# Patient Record
Sex: Female | Born: 1975
Health system: Southern US, Community
[De-identification: ages and names within clinical notes are randomized; demographics above are authoritative.]

## PROBLEM LIST (undated history)

## (undated) DIAGNOSIS — G43909 Migraine, unspecified, not intractable, without status migrainosus: Secondary | ICD-10-CM

## (undated) DIAGNOSIS — Z9889 Other specified postprocedural states: Secondary | ICD-10-CM

## (undated) DIAGNOSIS — G4730 Sleep apnea, unspecified: Secondary | ICD-10-CM

## (undated) DIAGNOSIS — E282 Polycystic ovarian syndrome: Secondary | ICD-10-CM

## (undated) DIAGNOSIS — R112 Nausea with vomiting, unspecified: Secondary | ICD-10-CM

## (undated) HISTORY — PX: ROUX-EN-Y GASTRIC BYPASS: SHX1104

## (undated) HISTORY — PX: ABDOMINAL HYSTERECTOMY: SHX81

---
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
## 1998-03-19 ENCOUNTER — Emergency Department (HOSPITAL_COMMUNITY): Admission: EM | Admit: 1998-03-19 | Discharge: 1998-03-19 | Payer: Self-pay | Admitting: Emergency Medicine

## 1998-03-21 ENCOUNTER — Ambulatory Visit (HOSPITAL_COMMUNITY): Admission: RE | Admit: 1998-03-21 | Discharge: 1998-03-21 | Payer: Self-pay | Admitting: Emergency Medicine

## 1998-03-21 ENCOUNTER — Encounter: Payer: Self-pay | Admitting: Emergency Medicine

## 1998-04-17 ENCOUNTER — Emergency Department (HOSPITAL_COMMUNITY): Admission: EM | Admit: 1998-04-17 | Discharge: 1998-04-18 | Payer: Self-pay | Admitting: Emergency Medicine

## 1999-01-25 ENCOUNTER — Emergency Department (HOSPITAL_COMMUNITY): Admission: EM | Admit: 1999-01-25 | Discharge: 1999-01-25 | Payer: Self-pay | Admitting: Internal Medicine

## 2000-12-31 ENCOUNTER — Emergency Department (HOSPITAL_COMMUNITY): Admission: EM | Admit: 2000-12-31 | Discharge: 2001-01-01 | Payer: Self-pay | Admitting: Emergency Medicine

## 2001-04-27 ENCOUNTER — Emergency Department (HOSPITAL_COMMUNITY): Admission: EM | Admit: 2001-04-27 | Discharge: 2001-04-27 | Payer: Self-pay | Admitting: *Deleted

## 2001-05-10 DIAGNOSIS — E282 Polycystic ovarian syndrome: Secondary | ICD-10-CM

## 2001-05-10 HISTORY — DX: Polycystic ovarian syndrome: E28.2

## 2002-01-03 ENCOUNTER — Emergency Department (HOSPITAL_COMMUNITY): Admission: EM | Admit: 2002-01-03 | Discharge: 2002-01-03 | Payer: Self-pay | Admitting: Emergency Medicine

## 2002-09-18 ENCOUNTER — Emergency Department (HOSPITAL_COMMUNITY): Admission: EM | Admit: 2002-09-18 | Discharge: 2002-09-18 | Payer: Self-pay | Admitting: Emergency Medicine

## 2003-07-17 ENCOUNTER — Emergency Department (HOSPITAL_COMMUNITY): Admission: EM | Admit: 2003-07-17 | Discharge: 2003-07-17 | Payer: Self-pay | Admitting: Emergency Medicine

## 2003-08-14 ENCOUNTER — Other Ambulatory Visit: Admission: RE | Admit: 2003-08-14 | Discharge: 2003-08-14 | Payer: Self-pay | Admitting: Obstetrics and Gynecology

## 2005-01-23 ENCOUNTER — Emergency Department (HOSPITAL_COMMUNITY): Admission: EM | Admit: 2005-01-23 | Discharge: 2005-01-23 | Payer: Self-pay | Admitting: Emergency Medicine

## 2005-09-14 ENCOUNTER — Emergency Department (HOSPITAL_COMMUNITY): Admission: EM | Admit: 2005-09-14 | Discharge: 2005-09-14 | Payer: Self-pay | Admitting: Emergency Medicine

## 2006-03-24 ENCOUNTER — Encounter: Admission: RE | Admit: 2006-03-24 | Discharge: 2006-03-24 | Payer: Self-pay | Admitting: *Deleted

## 2006-04-05 ENCOUNTER — Encounter: Admission: RE | Admit: 2006-04-05 | Discharge: 2006-07-04 | Payer: Self-pay | Admitting: *Deleted

## 2006-06-15 ENCOUNTER — Emergency Department (HOSPITAL_COMMUNITY): Admission: EM | Admit: 2006-06-15 | Discharge: 2006-06-15 | Payer: Self-pay | Admitting: Emergency Medicine

## 2006-07-15 ENCOUNTER — Emergency Department (HOSPITAL_COMMUNITY): Admission: EM | Admit: 2006-07-15 | Discharge: 2006-07-15 | Payer: Self-pay | Admitting: Emergency Medicine

## 2007-03-09 ENCOUNTER — Emergency Department (HOSPITAL_COMMUNITY): Admission: EM | Admit: 2007-03-09 | Discharge: 2007-03-10 | Payer: Self-pay | Admitting: Emergency Medicine

## 2007-04-09 ENCOUNTER — Emergency Department (HOSPITAL_COMMUNITY): Admission: EM | Admit: 2007-04-09 | Discharge: 2007-04-10 | Payer: Self-pay | Admitting: Emergency Medicine

## 2007-04-10 ENCOUNTER — Emergency Department (HOSPITAL_COMMUNITY): Admission: EM | Admit: 2007-04-10 | Discharge: 2007-04-11 | Payer: Self-pay | Admitting: Emergency Medicine

## 2007-09-22 ENCOUNTER — Emergency Department (HOSPITAL_COMMUNITY): Admission: EM | Admit: 2007-09-22 | Discharge: 2007-09-22 | Payer: Self-pay | Admitting: Emergency Medicine

## 2007-09-24 ENCOUNTER — Emergency Department (HOSPITAL_COMMUNITY): Admission: EM | Admit: 2007-09-24 | Discharge: 2007-09-24 | Payer: Self-pay | Admitting: Emergency Medicine

## 2008-05-10 HISTORY — PX: OTHER SURGICAL HISTORY: SHX169

## 2008-11-17 IMAGING — CR DG CHEST 2V
2 series · 2 of 2 positions shown · non-contrast
Comparison: 06/15/2006.

PA and lateral chest, 03/09/2007, [DATE] hours.
INDICATION: Nausea/vomiting, headache.

[w chest pa *]
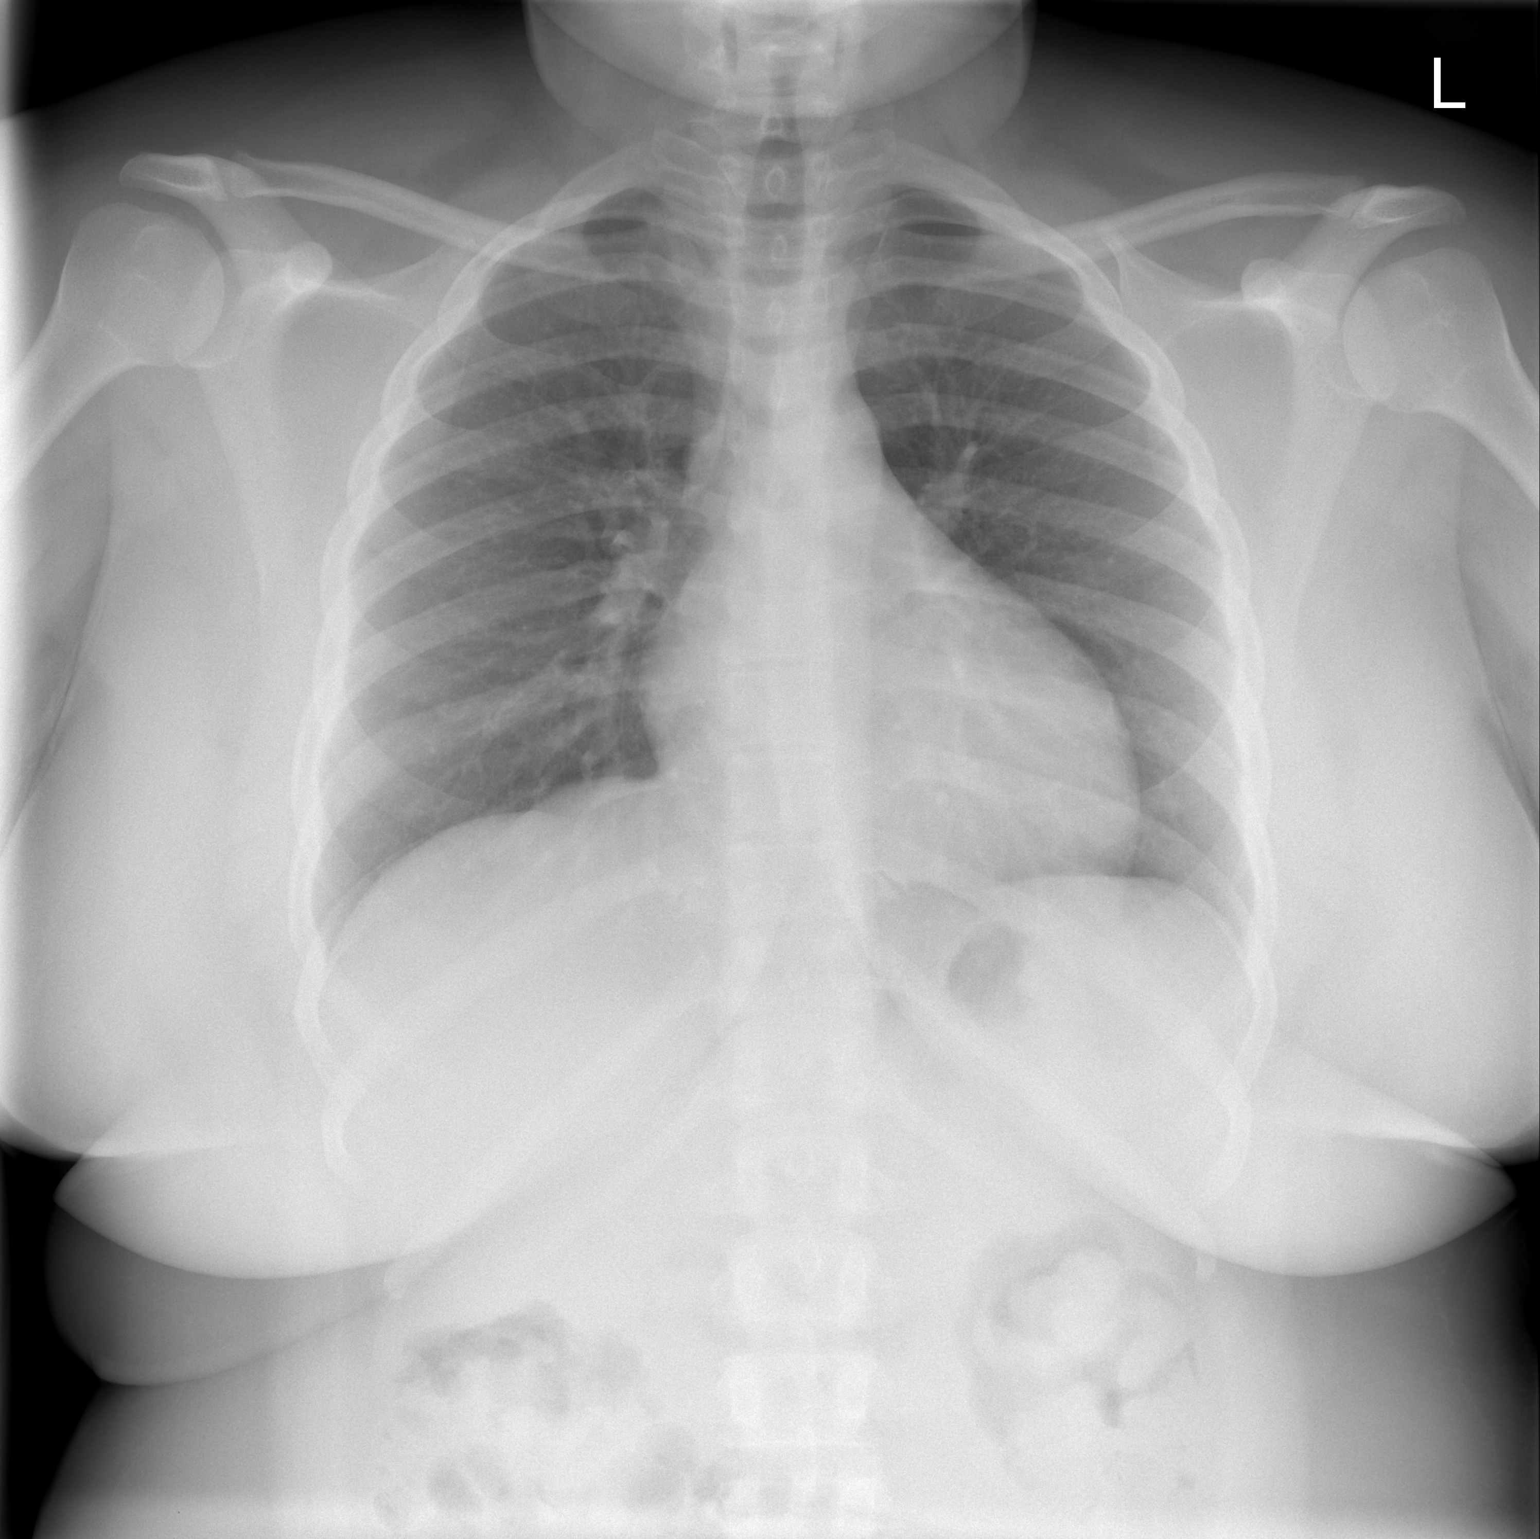

[w chest lat]
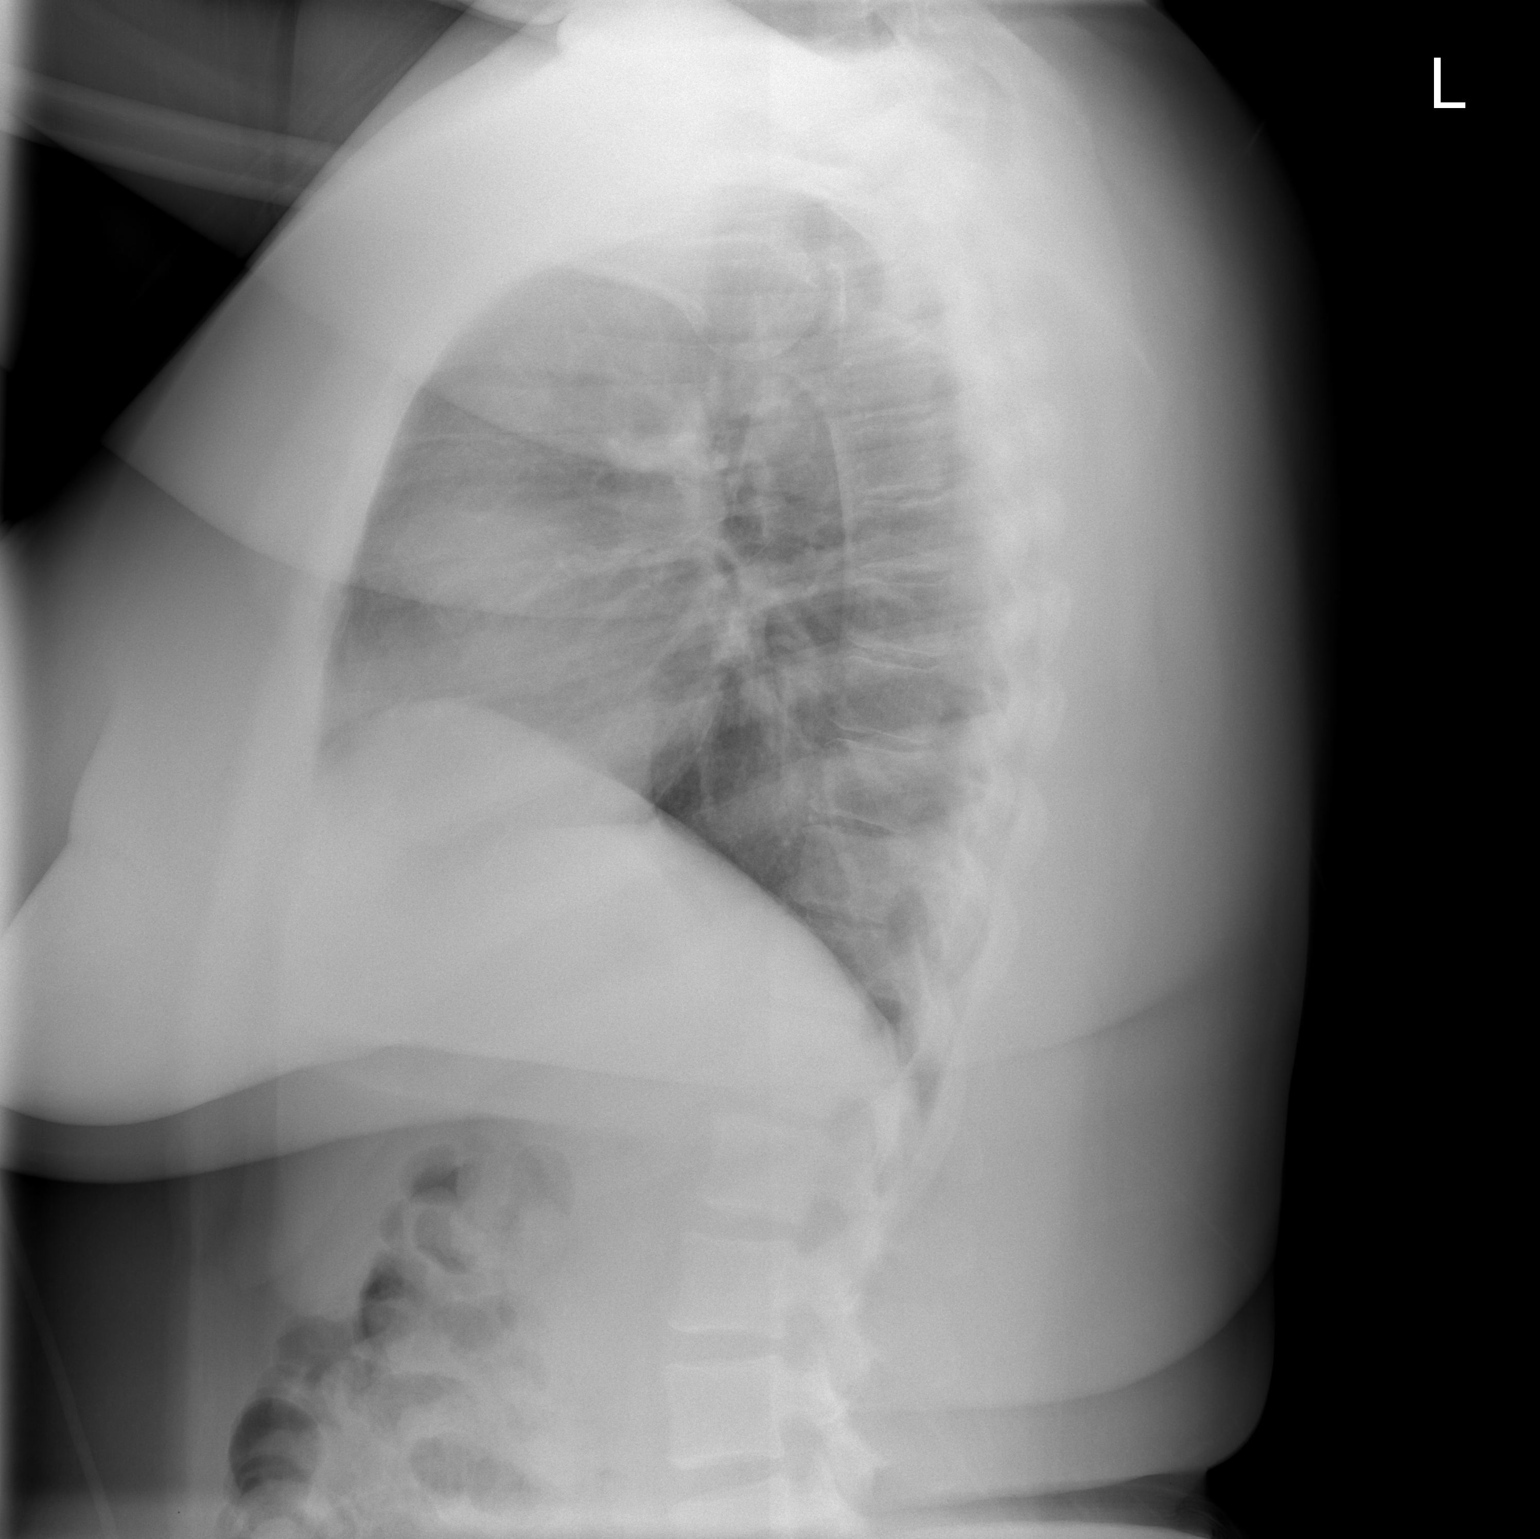

[2 of 2 positions shown; findings below may reference images not displayed]

FINDINGS: Heart size is within normal limits. Lungs appear clear. No edema,
effusions, airspace disease. Visualized upper abdomen unremarkable.
IMPRESSION: No active cardiac pulmonary disease.

## 2009-02-17 ENCOUNTER — Encounter (INDEPENDENT_AMBULATORY_CARE_PROVIDER_SITE_OTHER): Payer: Self-pay | Admitting: Obstetrics and Gynecology

## 2009-02-17 ENCOUNTER — Ambulatory Visit (HOSPITAL_COMMUNITY): Admission: RE | Admit: 2009-02-17 | Discharge: 2009-02-17 | Payer: Self-pay | Admitting: Obstetrics and Gynecology

## 2009-07-18 ENCOUNTER — Emergency Department (HOSPITAL_COMMUNITY): Admission: EM | Admit: 2009-07-18 | Discharge: 2009-07-18 | Payer: Self-pay | Admitting: Emergency Medicine

## 2010-03-04 ENCOUNTER — Encounter: Admission: RE | Admit: 2010-03-04 | Discharge: 2010-03-04 | Payer: Self-pay | Admitting: Obstetrics and Gynecology

## 2010-08-13 LAB — CBC
HCT: 34.4 % — ABNORMAL LOW (ref 36.0–46.0)
Hemoglobin: 11.2 g/dL — ABNORMAL LOW (ref 12.0–15.0)
MCHC: 32.4 g/dL (ref 30.0–36.0)
WBC: 11.9 10*3/uL — ABNORMAL HIGH (ref 4.0–10.5)

## 2010-08-13 LAB — BASIC METABOLIC PANEL
BUN: 9 mg/dL (ref 6–23)
Glucose, Bld: 77 mg/dL (ref 70–99)
Potassium: 4.2 mEq/L (ref 3.5–5.1)

## 2010-09-07 ENCOUNTER — Emergency Department (HOSPITAL_COMMUNITY)
Admission: EM | Admit: 2010-09-07 | Discharge: 2010-09-07 | Disposition: A | Discharge status: Home / Self Care | Payer: BC Managed Care – PPO | Attending: Emergency Medicine | Admitting: Emergency Medicine

## 2010-09-07 DIAGNOSIS — R51 Headache: Secondary | ICD-10-CM | POA: Insufficient documentation

## 2010-09-07 DIAGNOSIS — R112 Nausea with vomiting, unspecified: Secondary | ICD-10-CM | POA: Insufficient documentation

## 2010-09-07 LAB — CBC
Hemoglobin: 13.2 g/dL (ref 12.0–15.0)
RBC: 5.09 MIL/uL (ref 3.87–5.11)
RDW: 14.7 % (ref 11.5–15.5)
WBC: 10 10*3/uL (ref 4.0–10.5)

## 2010-09-07 LAB — COMPREHENSIVE METABOLIC PANEL
Calcium: 9.1 mg/dL (ref 8.4–10.5)
Chloride: 100 mEq/L (ref 96–112)
Creatinine, Ser: 0.71 mg/dL (ref 0.4–1.2)
GFR calc Af Amer: >60 mL/min (ref >60)
Glucose, Bld: 97 mg/dL (ref 70–99)
Potassium: 3.5 mEq/L (ref 3.5–5.1)
Sodium: 136 mEq/L (ref 135–145)
Total Bilirubin: 0.2 mg/dL — ABNORMAL LOW (ref 0.3–1.2)
Total Protein: 8 g/dL (ref 6.0–8.3)

## 2010-09-07 LAB — URINALYSIS, ROUTINE W REFLEX MICROSCOPIC
Bilirubin Urine: NEGATIVE
pH: 6 (ref 5.0–8.0)

## 2010-09-07 LAB — DIFFERENTIAL
Basophils Relative: 0 % (ref 0–1)
Eosinophils Absolute: 0.1 10*3/uL (ref 0.0–0.7)
Lymphocytes Relative: 27 % (ref 12–46)
Lymphs Abs: 2.7 10*3/uL (ref 0.7–4.0)
Monocytes Relative: 7 % (ref 3–12)

## 2010-09-07 LAB — POCT PREGNANCY, URINE: Preg Test, Ur: NEGATIVE

## 2010-09-07 LAB — GLUCOSE, CAPILLARY: Glucose-Capillary: 81 mg/dL (ref 70–99)

## 2011-02-03 LAB — URINALYSIS, ROUTINE W REFLEX MICROSCOPIC
Bilirubin Urine: NEGATIVE
Glucose, UA: NEGATIVE
Hgb urine dipstick: NEGATIVE
Ketones, ur: NEGATIVE
Nitrite: NEGATIVE
Protein, ur: NEGATIVE
Specific Gravity, Urine: 1.014
Urobilinogen, UA: 0.2
pH: 6.5

## 2011-02-03 LAB — URINE MICROSCOPIC-ADD ON

## 2011-02-15 LAB — COMPREHENSIVE METABOLIC PANEL
Albumin: 3.6
BUN: 7
GFR calc Af Amer: >60
GFR calc non Af Amer: >60
Glucose, Bld: 103 — ABNORMAL HIGH
Potassium: 3.5
Sodium: 137
Total Protein: 8

## 2011-02-15 LAB — ACETAMINOPHEN LEVEL: Acetaminophen (Tylenol), Serum: <10 — ABNORMAL LOW

## 2011-02-16 LAB — COMPREHENSIVE METABOLIC PANEL
ALT: 24
AST: 32
Alkaline Phosphatase: 68
BUN: 11
CO2: 33 — ABNORMAL HIGH
Chloride: 103
GFR calc Af Amer: >60
GFR calc non Af Amer: >60
Glucose, Bld: 94
Total Bilirubin: 0.8

## 2011-02-16 LAB — PREGNANCY, URINE: Preg Test, Ur: NEGATIVE

## 2011-02-16 LAB — URINALYSIS, ROUTINE W REFLEX MICROSCOPIC
Glucose, UA: NEGATIVE
Ketones, ur: NEGATIVE
Urobilinogen, UA: 0.2
pH: 6.5

## 2011-02-16 LAB — RAPID URINE DRUG SCREEN, HOSP PERFORMED
Barbiturates: NOT DETECTED
Opiates: POSITIVE — AB

## 2011-02-16 LAB — ACETAMINOPHEN LEVEL: Acetaminophen (Tylenol), Serum: 26.3

## 2011-02-16 LAB — SALICYLATE LEVEL: Salicylate Lvl: <4

## 2011-02-17 LAB — URINE MICROSCOPIC-ADD ON

## 2011-02-17 LAB — CBC
HCT: 41.3
Platelets: 344
RDW: 14
WBC: 7

## 2011-02-17 LAB — DIFFERENTIAL
Basophils Absolute: 0
Eosinophils Relative: 1
Lymphocytes Relative: 24
Lymphs Abs: 1.7
Neutro Abs: 4.2
Neutrophils Relative %: 60

## 2011-02-17 LAB — URINALYSIS, ROUTINE W REFLEX MICROSCOPIC
Glucose, UA: NEGATIVE
Ketones, ur: NEGATIVE
Nitrite: NEGATIVE
pH: 6

## 2011-02-17 LAB — BASIC METABOLIC PANEL
Calcium: 9.1
Creatinine, Ser: 0.73
GFR calc non Af Amer: >60
Potassium: 3.7
Sodium: 135

## 2011-04-05 ENCOUNTER — Encounter (HOSPITAL_COMMUNITY): Payer: Self-pay

## 2011-04-05 ENCOUNTER — Encounter (HOSPITAL_COMMUNITY): Payer: Self-pay | Admitting: Pharmacy Technician

## 2011-04-05 ENCOUNTER — Ambulatory Visit (HOSPITAL_COMMUNITY)
Admission: RE | Admit: 2011-04-05 | Discharge: 2011-04-05 | Disposition: A | Discharge status: Home / Self Care | Payer: BC Managed Care – PPO | Source: Ambulatory Visit | Attending: Obstetrics and Gynecology | Admitting: Obstetrics and Gynecology

## 2011-04-05 ENCOUNTER — Other Ambulatory Visit: Payer: Self-pay | Admitting: Obstetrics and Gynecology

## 2011-04-05 ENCOUNTER — Other Ambulatory Visit: Payer: Self-pay

## 2011-04-05 DIAGNOSIS — Z01812 Encounter for preprocedural laboratory examination: Secondary | ICD-10-CM | POA: Insufficient documentation

## 2011-04-05 DIAGNOSIS — Z01810 Encounter for preprocedural cardiovascular examination: Secondary | ICD-10-CM | POA: Insufficient documentation

## 2011-04-05 HISTORY — DX: Sleep apnea, unspecified: G47.30

## 2011-04-05 HISTORY — DX: Polycystic ovarian syndrome: E28.2

## 2011-04-05 HISTORY — DX: Nausea with vomiting, unspecified: R11.2

## 2011-04-05 HISTORY — DX: Other specified postprocedural states: Z98.890

## 2011-04-05 LAB — CBC
HCT: 39.2 % (ref 36.0–46.0)
Hemoglobin: 12.9 g/dL (ref 12.0–15.0)
MCH: 26.2 pg (ref 26.0–34.0)
MCHC: 32.9 g/dL (ref 30.0–36.0)
RDW: 14.5 % (ref 11.5–15.5)

## 2011-04-05 LAB — BASIC METABOLIC PANEL
BUN: 11 mg/dL (ref 6–23)
Calcium: 9.5 mg/dL (ref 8.4–10.5)
GFR calc Af Amer: >90 mL/min (ref >90)
GFR calc non Af Amer: >90 mL/min (ref >90)
Glucose, Bld: 81 mg/dL (ref 70–99)
Sodium: 135 mEq/L (ref 135–145)

## 2011-04-05 NOTE — Patient Instructions (Signed)
20 Lizbeth Feijoo  04/05/2011   Your procedure is scheduled on: 04-08-2011  Report to [facility] at 1100 AM.  Call this number if you have problems the morning of surgery: [phone]   Remember:   Do not eat food:After Midnight.  May have clear liquids:up to 6 Hours before arrival.clear liquids midnight until 0700 am day of surgery, then nothing by mouth  Clear liquids include soda, tea, black coffee, apple or grape juice, broth.  Take these medicines the morning of surgery with A SIP OF WATER:no meds to take   Do not wear jewelry, make-up or nail polish.  Do not wear lotions, powders, or perfumes.   Do not shave 48 hours prior to surgery.  Do not bring valuables to the hospital.  Contacts, dentures or bridgework may not be worn into surgery.  Leave suitcase in the car. After surgery it may be brought to your room.  For patients admitted to the hospital, checkout time is 11:00 AM the day of discharge.   Patients discharged the day of surgery will not be allowed to drive home.  Name and phone number of your driver  Special Instructions: CHG Shower Use Special Wash: 1/2 bottle night before surgery and 1/2 bottle morning of surgery.   Please read over the following fact sheets that you were given: MRSA Information Cain Sieve rn pre op nurse call if question [phone]

## 2011-04-08 ENCOUNTER — Encounter (HOSPITAL_COMMUNITY): Payer: Self-pay | Admitting: *Deleted

## 2011-04-08 ENCOUNTER — Ambulatory Visit (HOSPITAL_COMMUNITY): Payer: BC Managed Care – PPO | Admitting: Registered Nurse

## 2011-04-08 ENCOUNTER — Other Ambulatory Visit: Payer: Self-pay | Admitting: Obstetrics and Gynecology

## 2011-04-08 ENCOUNTER — Encounter (HOSPITAL_COMMUNITY)
Admission: RE | Disposition: A | Discharge status: Home / Self Care | Payer: Self-pay | Source: Ambulatory Visit | Attending: Obstetrics and Gynecology

## 2011-04-08 ENCOUNTER — Ambulatory Visit (HOSPITAL_COMMUNITY)
Admission: RE | Admit: 2011-04-08 | Discharge: 2011-04-09 | Disposition: A | Discharge status: Home / Self Care | Payer: BC Managed Care – PPO | Source: Ambulatory Visit | Attending: Obstetrics and Gynecology | Admitting: Obstetrics and Gynecology

## 2011-04-08 ENCOUNTER — Encounter (HOSPITAL_COMMUNITY): Payer: Self-pay | Admitting: Registered Nurse

## 2011-04-08 DIAGNOSIS — E119 Type 2 diabetes mellitus without complications: Secondary | ICD-10-CM | POA: Insufficient documentation

## 2011-04-08 DIAGNOSIS — IMO0001 Reserved for inherently not codable concepts without codable children: Secondary | ICD-10-CM | POA: Insufficient documentation

## 2011-04-08 DIAGNOSIS — N949 Unspecified condition associated with female genital organs and menstrual cycle: Secondary | ICD-10-CM | POA: Insufficient documentation

## 2011-04-08 DIAGNOSIS — Z90710 Acquired absence of both cervix and uterus: Secondary | ICD-10-CM

## 2011-04-08 DIAGNOSIS — N840 Polyp of corpus uteri: Secondary | ICD-10-CM | POA: Insufficient documentation

## 2011-04-08 DIAGNOSIS — R112 Nausea with vomiting, unspecified: Secondary | ICD-10-CM | POA: Insufficient documentation

## 2011-04-08 DIAGNOSIS — N938 Other specified abnormal uterine and vaginal bleeding: Secondary | ICD-10-CM | POA: Insufficient documentation

## 2011-04-08 HISTORY — PX: ROBOTIC ASSISTED LAP VAGINAL HYSTERECTOMY: SHX2362

## 2011-04-08 LAB — GLUCOSE, CAPILLARY: Glucose-Capillary: 135 mg/dL — ABNORMAL HIGH (ref 70–99)

## 2011-04-08 LAB — TYPE AND SCREEN

## 2011-04-08 LAB — ABO/RH: ABO/RH(D): O POS

## 2011-04-08 SURGERY — ROBOTIC ASSISTED LAPAROSCOPIC VAGINAL HYSTERECTOMY
Anesthesia: General | Site: Abdomen | Wound class: Clean Contaminated

## 2011-04-08 MED ORDER — HYDROMORPHONE HCL PF 1 MG/ML IJ SOLN
0.2000 mg | INTRAMUSCULAR | Status: DC | PRN
Start: 2011-04-08 — End: 2011-04-09

## 2011-04-08 MED ORDER — SUFENTANIL CITRATE 50 MCG/ML IV SOLN
INTRAVENOUS | Status: DC | PRN
  Administered 2011-04-08 (×2): 5 ug via INTRAVENOUS
  Administered 2011-04-08 (×2): 10 ug via INTRAVENOUS
  Administered 2011-04-08: 5 ug via INTRAVENOUS

## 2011-04-08 MED ORDER — LACTATED RINGERS IV SOLN
INTRAVENOUS | Status: DC | PRN
  Administered 2011-04-08 (×3): via INTRAVENOUS

## 2011-04-08 MED ORDER — OXYCODONE-ACETAMINOPHEN 5-325 MG PO TABS: 1.0000 | ORAL_TABLET | ORAL | Status: DC | PRN

## 2011-04-08 MED ORDER — HYDROMORPHONE 0.3 MG/ML IV SOLN
INTRAVENOUS | Status: DC
  Administered 2011-04-08: 7.5 mg via INTRAVENOUS
  Administered 2011-04-08: 0.799 mg via INTRAVENOUS
  Administered 2011-04-09: 0.4 mg via INTRAVENOUS
  Administered 2011-04-09: 0.799 mg via INTRAVENOUS
  Administered 2011-04-09: 0.2 mg via INTRAVENOUS
  Filled 2011-04-08: qty 25

## 2011-04-08 MED ORDER — GLYCOPYRROLATE 0.2 MG/ML IJ SOLN
INTRAMUSCULAR | Status: DC | PRN
  Administered 2011-04-08: .6 mg via INTRAVENOUS

## 2011-04-08 MED ORDER — MIDAZOLAM HCL 5 MG/5ML IJ SOLN
INTRAMUSCULAR | Status: DC | PRN
  Administered 2011-04-08: 2 mg via INTRAVENOUS

## 2011-04-08 MED ORDER — ONDANSETRON HCL 4 MG/2ML IJ SOLN
INTRAMUSCULAR | Status: DC | PRN
  Administered 2011-04-08: 4 mg via INTRAVENOUS

## 2011-04-08 MED ORDER — KETOROLAC TROMETHAMINE 60 MG/2ML IM SOLN
INTRAMUSCULAR | Status: DC | PRN
  Administered 2011-04-08: 30 mg via INTRAMUSCULAR

## 2011-04-08 MED ORDER — ZOLPIDEM TARTRATE 5 MG PO TABS: 5.0000 mg | ORAL_TABLET | Freq: Every evening | ORAL | Status: DC | PRN

## 2011-04-08 MED ORDER — SODIUM CHLORIDE 0.9 % IJ SOLN
INTRAMUSCULAR | Status: DC | PRN
  Administered 2011-04-08: 5 mL

## 2011-04-08 MED ORDER — ONDANSETRON HCL 4 MG/2ML IJ SOLN: 4.0000 mg | Freq: Four times a day (QID) | INTRAMUSCULAR | Status: DC | PRN

## 2011-04-08 MED ORDER — KETOROLAC TROMETHAMINE 30 MG/ML IJ SOLN
30.0000 mg | Freq: Four times a day (QID) | INTRAMUSCULAR | Status: DC
  Administered 2011-04-09 (×2): 30 mg via INTRAVENOUS
  Filled 2011-04-08 (×6): qty 1

## 2011-04-08 MED ORDER — LACTATED RINGERS IR SOLN
Status: DC | PRN
  Administered 2011-04-08: 1000 mL

## 2011-04-08 MED ORDER — MENTHOL 3 MG MT LOZG
1.0000 | LOZENGE | OROMUCOSAL | Status: DC | PRN
  Filled 2011-04-08: qty 9

## 2011-04-08 MED ORDER — LACTATED RINGERS IV SOLN
INTRAVENOUS | Status: DC
  Administered 2011-04-09: 125 mL/h via INTRAVENOUS

## 2011-04-08 MED ORDER — IBUPROFEN 800 MG PO TABS: 800.0000 mg | ORAL_TABLET | Freq: Three times a day (TID) | ORAL | Status: DC | PRN

## 2011-04-08 MED ORDER — CLINDAMYCIN PHOSPHATE 900 MG/50ML IV SOLN
900.0000 mg | Freq: Three times a day (TID) | INTRAVENOUS | Status: AC
  Administered 2011-04-08 – 2011-04-09 (×2): 900 mg via INTRAVENOUS
  Filled 2011-04-08 (×2): qty 50

## 2011-04-08 MED ORDER — KETOROLAC TROMETHAMINE 30 MG/ML IJ SOLN
30.0000 mg | Freq: Four times a day (QID) | INTRAMUSCULAR | Status: DC
  Filled 2011-04-08 (×4): qty 1

## 2011-04-08 MED ORDER — CIPROFLOXACIN IN D5W 400 MG/200ML IV SOLN
INTRAVENOUS | Status: DC | PRN
  Administered 2011-04-08: 400 mg via INTRAVENOUS

## 2011-04-08 MED ORDER — PROPOFOL 10 MG/ML IV EMUL
INTRAVENOUS | Status: DC | PRN
  Administered 2011-04-08: 100 mg via INTRAVENOUS
  Administered 2011-04-08: 200 mg via INTRAVENOUS

## 2011-04-08 MED ORDER — NEOSTIGMINE METHYLSULFATE 1 MG/ML IJ SOLN
INTRAMUSCULAR | Status: DC | PRN
  Administered 2011-04-08: 5 mg via INTRAVENOUS

## 2011-04-08 MED ORDER — CIPROFLOXACIN IN D5W 400 MG/200ML IV SOLN
400.0000 mg | Freq: Two times a day (BID) | INTRAVENOUS | Status: DC
  Administered 2011-04-09: 400 mg via INTRAVENOUS
  Filled 2011-04-08 (×2): qty 200

## 2011-04-08 MED ORDER — SODIUM CHLORIDE 0.9 % IJ SOLN: 9.0000 mL | INTRAMUSCULAR | Status: DC | PRN

## 2011-04-08 MED ORDER — PANTOPRAZOLE SODIUM 40 MG PO TBEC
40.0000 mg | DELAYED_RELEASE_TABLET | Freq: Every day | ORAL | Status: DC
  Filled 2011-04-08: qty 1

## 2011-04-08 MED ORDER — SCOPOLAMINE 1 MG/3DAYS TD PT72
1.0000 | MEDICATED_PATCH | TRANSDERMAL | Status: DC
  Filled 2011-04-08: qty 1

## 2011-04-08 MED ORDER — LACTATED RINGERS IV SOLN
INTRAVENOUS | Status: DC
  Administered 2011-04-08: 1000 mL via INTRAVENOUS

## 2011-04-08 MED ORDER — BUPIVACAINE HCL (PF) 0.25 % IJ SOLN
INTRAMUSCULAR | Status: DC | PRN
  Administered 2011-04-08: 22 mL

## 2011-04-08 MED ORDER — DEXAMETHASONE SODIUM PHOSPHATE 10 MG/ML IJ SOLN
INTRAMUSCULAR | Status: DC | PRN
  Administered 2011-04-08: 10 mg via INTRAVENOUS

## 2011-04-08 MED ORDER — ACETAMINOPHEN 10 MG/ML IV SOLN
INTRAVENOUS | Status: DC | PRN
  Administered 2011-04-08: 1000 mg via INTRAVENOUS

## 2011-04-08 MED ORDER — DIPHENHYDRAMINE HCL 50 MG/ML IJ SOLN: 12.5000 mg | Freq: Four times a day (QID) | INTRAMUSCULAR | Status: DC | PRN

## 2011-04-08 MED ORDER — DIPHENHYDRAMINE HCL 12.5 MG/5ML PO ELIX: 12.5000 mg | ORAL_SOLUTION | Freq: Four times a day (QID) | ORAL | Status: DC | PRN

## 2011-04-08 MED ORDER — SUCCINYLCHOLINE CHLORIDE 20 MG/ML IJ SOLN
INTRAMUSCULAR | Status: DC | PRN
  Administered 2011-04-08 (×2): 100 mg via INTRAVENOUS

## 2011-04-08 MED ORDER — ONDANSETRON HCL 4 MG PO TABS: 4.0000 mg | ORAL_TABLET | Freq: Four times a day (QID) | ORAL | Status: DC | PRN

## 2011-04-08 MED ORDER — PROMETHAZINE HCL 25 MG/ML IJ SOLN: 6.2500 mg | INTRAMUSCULAR | Status: DC | PRN

## 2011-04-08 MED ORDER — CLINDAMYCIN PHOSPHATE 600 MG/50ML IV SOLN
INTRAVENOUS | Status: DC | PRN
  Administered 2011-04-08: 900 mg via INTRAVENOUS

## 2011-04-08 MED ORDER — STERILE WATER FOR IRRIGATION IR SOLN
Status: DC | PRN
  Administered 2011-04-08: 3000 mL

## 2011-04-08 MED ORDER — ROCURONIUM BROMIDE 100 MG/10ML IV SOLN
INTRAVENOUS | Status: DC | PRN
  Administered 2011-04-08: 50 mg via INTRAVENOUS
  Administered 2011-04-08: 20 mg via INTRAVENOUS

## 2011-04-08 MED ORDER — CLINDAMYCIN PHOSPHATE 900 MG/50ML IV SOLN
900.0000 mg | INTRAVENOUS | Status: DC
  Filled 2011-04-08: qty 50

## 2011-04-08 MED ORDER — GLIMEPIRIDE 1 MG PO TABS
1.0000 mg | ORAL_TABLET | Freq: Every day | ORAL | Status: DC
  Administered 2011-04-09: 1 mg via ORAL
  Filled 2011-04-08: qty 1

## 2011-04-08 MED ORDER — HYDROMORPHONE HCL PF 1 MG/ML IJ SOLN
0.2500 mg | INTRAMUSCULAR | Status: DC | PRN
  Administered 2011-04-08: 0.5 mg via INTRAVENOUS
  Filled 2011-04-08: qty 1

## 2011-04-08 MED ORDER — NALOXONE HCL 0.4 MG/ML IJ SOLN: 0.4000 mg | INTRAMUSCULAR | Status: DC | PRN

## 2011-04-08 MED ORDER — CIPROFLOXACIN IN D5W 400 MG/200ML IV SOLN
400.0000 mg | INTRAVENOUS | Status: DC
  Filled 2011-04-08: qty 200

## 2011-04-08 MED ORDER — SCOPOLAMINE 1 MG/3DAYS TD PT72
MEDICATED_PATCH | TRANSDERMAL | Status: DC | PRN
  Administered 2011-04-08: 1.5 mg via TRANSDERMAL

## 2011-04-08 MED ORDER — KETOROLAC TROMETHAMINE 30 MG/ML IJ SOLN
INTRAMUSCULAR | Status: DC | PRN
  Administered 2011-04-08: 30 mg via INTRAVENOUS

## 2011-04-08 MED ORDER — LIDOCAINE HCL (CARDIAC) 20 MG/ML IV SOLN
INTRAVENOUS | Status: DC | PRN
  Administered 2011-04-08: 100 mg via INTRAVENOUS

## 2011-04-08 SURGICAL SUPPLY — 59 items
BAG URINE DRAINAGE (UROLOGICAL SUPPLIES) ×4 IMPLANT
BARRIER ADHS 3X4 INTERCEED (GAUZE/BANDAGES/DRESSINGS) ×4 IMPLANT
BLADELESS LONG 8MM (BLADE) ×4 IMPLANT
CABLE HIGH FREQUENCY MONO STRZ (ELECTRODE) ×4 IMPLANT
CATH FOLEY 3WAY  5CC 16FR (CATHETERS) ×1
CATH FOLEY 3WAY 5CC 16FR (CATHETERS) ×3 IMPLANT
CHLORAPREP W/TINT 26ML (MISCELLANEOUS) ×4 IMPLANT
CLOTH BEACON ORANGE TIMEOUT ST (SAFETY) ×4 IMPLANT
CONT PATH 16OZ SNAP LID 3702 (MISCELLANEOUS) ×4 IMPLANT
COVER MAYO STAND STRL (DRAPES) ×4 IMPLANT
COVER TIP SHEARS 8 DVNC (MISCELLANEOUS) ×3 IMPLANT
COVER TIP SHEARS 8MM DA VINCI (MISCELLANEOUS) ×1
DECANTER SPIKE VIAL GLASS SM (MISCELLANEOUS) IMPLANT
DERMABOND ADVANCED (GAUZE/BANDAGES/DRESSINGS) ×1
DERMABOND ADVANCED .7 DNX12 (GAUZE/BANDAGES/DRESSINGS) ×3 IMPLANT
DRAPE BACK TABLE (DRAPES) ×8 IMPLANT
DRAPE CAMERA CLOSED 9X96 (DRAPES) ×4 IMPLANT
DRAPE LG THREE QUARTER DISP (DRAPES) ×8 IMPLANT
DRAPE WARM FLUID 44X44 (DRAPE) ×4 IMPLANT
ELECT REM PT RETURN 9FT ADLT (ELECTROSURGICAL) ×4
ELECTRODE REM PT RTRN 9FT ADLT (ELECTROSURGICAL) ×3 IMPLANT
FILTER SMOKE EVAC LAPAROSHD (FILTER) ×4 IMPLANT
GAUZE VASELINE 3X9 (GAUZE/BANDAGES/DRESSINGS) IMPLANT
GLOVE BIO SURGEON STRL SZ 6.5 (GLOVE) ×8 IMPLANT
GLOVE BIOGEL PI IND STRL 7.0 (GLOVE) ×9 IMPLANT
GLOVE BIOGEL PI INDICATOR 7.0 (GLOVE) ×3
GOWN PREVENTION PLUS LG XLONG (DISPOSABLE) ×16 IMPLANT
KIT ACCESSORY DA VINCI DISP (KITS) ×1
KIT ACCESSORY DVNC DISP (KITS) ×3 IMPLANT
NEEDLE INSUFFLATION 14GA 120MM (NEEDLE) ×12 IMPLANT
NS IRRIG 1000ML POUR BTL (IV SOLUTION) ×4 IMPLANT
OCCLUDER COLPOPNEUMO (BALLOONS) ×4 IMPLANT
PAD PREP 24X48 CUFFED NSTRL (MISCELLANEOUS) IMPLANT
PLUG CATH AND CAP STER (CATHETERS) ×4 IMPLANT
SCISSORS LAP 5X35 DISP (ENDOMECHANICALS) IMPLANT
SET IRRIG TUBING LAPAROSCOPIC (IRRIGATION / IRRIGATOR) ×4 IMPLANT
SHEET LAVH (DRAPES) ×4 IMPLANT
SOLUTION ELECTROLUBE (MISCELLANEOUS) ×4 IMPLANT
SPONGE LAP 18X18 X RAY DECT (DISPOSABLE) IMPLANT
SUT VIC AB 0 CT1 27 (SUTURE) ×7
SUT VIC AB 0 CT1 27XBRD ANTBC (SUTURE) ×21 IMPLANT
SUT VIC AB 4-0 PS2 27 (SUTURE) ×4 IMPLANT
SUT VICRYL 0 UR6 27IN ABS (SUTURE) ×4 IMPLANT
SYR 50ML LL SCALE MARK (SYRINGE) ×4 IMPLANT
SYR CONTROL 10ML LL (SYRINGE) ×4 IMPLANT
SYSTEM CONVERTIBLE TROCAR (TROCAR) ×4 IMPLANT
TIP UTERINE 5.1X6CM LAV DISP (MISCELLANEOUS) IMPLANT
TIP UTERINE 6.7X10CM GRN DISP (MISCELLANEOUS) ×4 IMPLANT
TIP UTERINE 6.7X6CM WHT DISP (MISCELLANEOUS) IMPLANT
TIP UTERINE 6.7X8CM BLUE DISP (MISCELLANEOUS) IMPLANT
TOWEL OR 17X26 10 PK STRL BLUE (TOWEL DISPOSABLE) ×8 IMPLANT
TROCAR 12M 150ML BLUNT (TROCAR) IMPLANT
TROCAR BLADELESS OPT 5 100 (ENDOMECHANICALS) ×4 IMPLANT
TROCAR DISP BLADELESS 8 DVNC (TROCAR) ×3 IMPLANT
TROCAR DISP BLADELESS 8MM (TROCAR) ×1
TROCAR Z-THREAD 12X150 (TROCAR) ×4 IMPLANT
TROCAR Z-THREAD BLADED 12X100M (TROCAR) ×4 IMPLANT
TUBING FILTER THERMOFLATOR (ELECTROSURGICAL) ×4 IMPLANT
WARMER LAPAROSCOPE (MISCELLANEOUS) IMPLANT

## 2011-04-08 NOTE — Transfer of Care (Signed)
Immediate Anesthesia Transfer of Care Note  Patient: Helen Guerrero  Procedure(s) Performed:  ROBOTIC ASSISTED TOTAL HYSTERECTOMY WITH SALPINGO OOPHERECTOMY - DaVinci TLH with bilateral salpingectomy.    Patient Location: PACU  Anesthesia Type: General  Level of Consciousness: awake and alert   Airway & Oxygen Therapy: Patient Spontanous Breathing and Patient connected to face mask oxygen  Post-op Assessment: Report given to PACU RN and Post -op Vital signs reviewed and stable  Post vital signs: Reviewed and stable  Complications: No apparent anesthesia complications

## 2011-04-08 NOTE — Brief Op Note (Signed)
04/08/2011  4:46 PM  PATIENT:  Helen Guerrero  35 y.o. female  PRE-OPERATIVE DIAGNOSIS:  Dysfunctional Uterine Bleeding  POST-OPERATIVE DIAGNOSIS:  Dysfunctional Uterine Bleeding  PROCEDURE:  Procedure(s):Da Vinci ROBOTIC ASSISTED TOTAL HYSTERECTOMY WITH SALPINGECTOMY  SURGEON:  Surgeon(s): Serita Kyle, MD Alphonsus Sias. Fogleman  PHYSICIAN ASSISTANT:   ASSISTANTS: KELLY A FOGLEMAN  ANESTHESIA:   general  EBL:  Total I/O In: 2000 [I.V.:2000] Out: 380 [Urine:350; Blood:30]  BLOOD ADMINISTERED:none  DRAINS: none   LOCAL MEDICATIONS USED:  MARCAINE 10CC  SPECIMEN:  Source of Specimen:  uterus with cervix, both tubes  DISPOSITION OF SPECIMEN:  PATHOLOGY  COUNTS:  YES  TOURNIQUET:  * No tourniquets in log *  DICTATION: .Other Dictation: Dictation Number [id number]  PLAN OF CARE: Admit for overnight observation  PATIENT DISPOSITION:  PACU - hemodynamically stable.   Delay start of Pharmacological VTE agent (>24hrs) due to surgical blood loss or risk of bleeding:  no

## 2011-04-08 NOTE — Anesthesia Postprocedure Evaluation (Signed)
  Anesthesia Post-op Note  Patient: Helen Guerrero  Procedure(s) Performed:  ROBOTIC ASSISTED LAPAROSCOPIC VAGINAL HYSTERECTOMY; BILATERAL SALPINGECTOMY  Patient Location: PACU  Anesthesia Type: General  Level of Consciousness: awake and alert   Airway and Oxygen Therapy: Patient Spontanous Breathing  Post-op Pain: mild  Post-op Assessment: Post-op Vital signs reviewed, Patient's Cardiovascular Status Stable, Respiratory Function Stable, Patent Airway and No signs of Nausea or vomiting  Post-op Vital Signs: stable  Complications: No apparent anesthesia complications

## 2011-04-08 NOTE — Anesthesia Preprocedure Evaluation (Signed)
Anesthesia Evaluation  Patient identified by MRN, date of birth, ID band Patient awake    Reviewed: Allergy & Precautions, H&P , NPO status , Patient's Chart, lab work & pertinent test results  History of Anesthesia Complications (+) PONV  Airway Mallampati: III TM Distance: >3 FB Neck ROM: Full    Dental No notable dental hx.    Pulmonary neg pulmonary ROS, sleep apnea ,  clear to auscultation  Pulmonary exam normal       Cardiovascular neg cardio ROS Regular Normal    Neuro/Psych Negative Neurological ROS  Negative Psych ROS   GI/Hepatic negative GI ROS, Neg liver ROS,   Endo/Other  Negative Endocrine ROSDiabetes mellitus-  Renal/GU negative Renal ROS  Genitourinary negative   Musculoskeletal negative musculoskeletal ROS (+)   Abdominal (+) obese,   Peds negative pediatric ROS (+)  Hematology negative hematology ROS (+)   Anesthesia Other Findings   Reproductive/Obstetrics negative OB ROS                           Anesthesia Physical Anesthesia Plan  ASA: III  Anesthesia Plan: General   Post-op Pain Management:    Induction: Intravenous  Airway Management Planned: Oral ETT  Additional Equipment:   Intra-op Plan:   Post-operative Plan: Extubation in OR  Informed Consent: I have reviewed the patients History and Physical, chart, labs and discussed the procedure including the risks, benefits and alternatives for the proposed anesthesia with the patient or authorized representative who has indicated his/her understanding and acceptance.   Dental advisory given  Plan Discussed with: CRNA  Anesthesia Plan Comments: (Glidescope? Scopolamine patch)        Anesthesia Quick Evaluation

## 2011-04-08 NOTE — Preoperative (Signed)
Beta Blockers   Reason not to administer Beta Blockers:Not Applicable 

## 2011-04-09 ENCOUNTER — Encounter (HOSPITAL_COMMUNITY): Payer: Self-pay | Admitting: *Deleted

## 2011-04-09 DIAGNOSIS — N938 Other specified abnormal uterine and vaginal bleeding: Secondary | ICD-10-CM | POA: Diagnosis present

## 2011-04-09 DIAGNOSIS — Z90710 Acquired absence of both cervix and uterus: Secondary | ICD-10-CM | POA: Diagnosis not present

## 2011-04-09 LAB — CBC
HCT: 36.7 % (ref 36.0–46.0)
Platelets: 370 10*3/uL (ref 150–400)
RBC: 4.65 MIL/uL (ref 3.87–5.11)
RDW: 14.3 % (ref 11.5–15.5)
WBC: 12.2 10*3/uL — ABNORMAL HIGH (ref 4.0–10.5)

## 2011-04-09 LAB — BASIC METABOLIC PANEL
BUN: 6 mg/dL (ref 6–23)
CO2: 25 mEq/L (ref 19–32)
Chloride: 100 mEq/L (ref 96–112)
GFR calc Af Amer: >90 mL/min (ref >90)
Potassium: 4.2 mEq/L (ref 3.5–5.1)

## 2011-04-09 MED ORDER — PNEUMOCOCCAL VAC POLYVALENT 25 MCG/0.5ML IJ INJ
0.5000 mL | INJECTION | Freq: Once | INTRAMUSCULAR | Status: AC
  Administered 2011-04-09: 0.5 mL via INTRAMUSCULAR
  Filled 2011-04-09: qty 0.5

## 2011-04-09 MED ORDER — IBUPROFEN 800 MG PO TABS: 800.0000 mg | ORAL_TABLET | Freq: Three times a day (TID) | ORAL | Status: AC | PRN

## 2011-04-09 MED ORDER — OXYCODONE-ACETAMINOPHEN 5-325 MG PO TABS: 1.0000 | ORAL_TABLET | ORAL | Status: AC | PRN

## 2011-04-09 NOTE — Op Note (Signed)
NAMECHELSI, Helen Guerrero NO.:  [id number]  MEDICAL RECORD NO.:  [id number]  LOCATION:  1529                         FACILITY:  [facility]  PHYSICIAN:  Maxie Better, M.D.DATE OF BIRTH:  02-Jun-1975  DATE OF PROCEDURE:  04/08/2011 DATE OF DISCHARGE:                              OPERATIVE REPORT   PREOPERATIVE DIAGNOSIS:  Dysfunctional uterine bleeding.  POSTOPERATIVE DIAGNOSIS:  Dysfunctional uterine bleeding.  PROCEDURE:  Da Vinci robotic total hysterectomy, bilateral salpingectomy.  ANESTHESIA:  General.  SURGEON:  Maxie Better, M.D.  ASSISTANT:  Lendon Colonel, MD  PROCEDURE:  Under adequate general anesthesia, the patient was placed in the dorsal lithotomy position.  Examination under anesthesia was notable for a uterus that was appeared to be normal size.  No adnexal masses could be appreciated, but limited by the patient's body habitus.  The patient was then sterilely prepped and draped in usual fashion. Weighted speculum placed in the vagina.  Single-tooth tenaculum was used to grasp the anterior lip of the cervix and Sims cannula was used to retract the bladder anteriorly.  0 Vicryl figure-of-8 sutures were placed on the anterior and posterior lip of the cervix.  Uterus sounded to 10 cm.  A #10 intracavitary balloon manipulator was placed in the uterine cavity along with the medium KOH Rumi ring.  Once this was accomplished, the Foley catheter was placed sterilely.  Attention was then turned back to the abdomen where 0.25% Marcaine was injected and a supraumbilical incision made.  Veress needle was introduced until about 2.5 L of CO2 was insufflated.  The weighted speculum and this retractor was removed. The abdomen was insufflated of CO2.  Veress needle was then removed. A 12 mm disposable trocar was introduced into the abdomen without incident.  The robotic camera was then placed through that port.  Once this was done, the panoramic  inspection showed normal liver edge.  No other evidence of any other issues.  The robotic port sites were two 8 mm robotic instruments on the left, one on the right along with a 5 mm assistant port.  These sites were then resulted in the robotic instruments being placed.  Once this was done, the robot was then side docked from the left and attached to these ports. I then went to the surgical console.  The uterus was slightly enlarged without any nodule.  The ovaries were normal. The fallopian tubes were slightly elongated.  The round ligaments were bilaterally clamped, cauterized, and cut.  The mesosalpinx on the both fallopian tubes was serially clamped, cauterized, and cut until they were removed. Uterine vessels were then skeletonized bilaterally.  The bladder pertitoneum was opened transversely at that point and displaced inferiorly. Once the uterine vessels were secured, the bladder site was inspected and further dissection performed. The vaginal insufflator was filled. Subsequently, circumferential incision was then made over the core ring. The uterus was then detached from the vagina. The uterus with tubes was then removed through the vaginal opening. The vaginal cuff was then closed with 0 Vicryl figure-of-8 sutures.  The abdomen was then copiously irrigated. Good hemostasis subsequently noted. The robotic instruments were removed and the robot was undocked. Inspection showed good hemostasis. The abdomen was  deflated. The port sites was closed with 4-0 Vicryl subcuticular sutures, 0 Vicryl for fascial closure and dermabond.  The bimanual examination of the vaginal cuff showed good approximation.  SPECIMEN:  Uterus with cervix and fallopian tubes sent to pathology.  ESTIMATED BLOOD LOSS:  30 cc.  INTRAOPERATIVE FLUIDS:  2 L.  URINE OUTPUT:  350 cc clear yellow urine.  Sponge and instrument counts x2 was correct.  COMPLICATIONS:  None.  The patient tolerated procedure well and  transferred to recovery in stable condition.     Maxie Better, M.D.     Frazee/MEDQ  D:  04/08/2011  T:  04/09/2011  Job:  [id number]

## 2011-04-09 NOTE — Progress Notes (Signed)
Subjective: Patient reports no problems voiding. Feels well. Was too sleepy to eat last night. breaqkfast ordered this am  Objective: I have reviewed patient's vital signs.  vital signs, intake and output and labs. Filed Vitals:   04/09/11 0700  BP: 125/85  Pulse: 77  Temp: 98 F (36.7 C)  Resp: 20   I/O last 3 completed shifts: In: 2350 [I.V.:2350] Out: 2955 [Urine:2925; Blood:30]    Lab Results  Component Value Date   WBC 12.2* 04/09/2011   HGB 12.0 04/09/2011   HCT 36.7 04/09/2011   MCV 78.9 04/09/2011   PLT 370 04/09/2011   Lab Results  Component Value Date   CREATININE 0.69 04/09/2011    EXAM General: alert, cooperative and no distress Resp: clear to auscultation bilaterally Cardio: regular rate and rhythm, S1, S2 normal, no murmur, click, rub or gallop GI: soft, non-tender; bowel sounds normal; no masses,  no organomegaly and incision: clean and dry Extremities: no edema, redness or tenderness in the calves or thighs Vaginal Bleeding: none  Assessment: s/p Procedure(s): ROBOTIC ASSISTED TOTAL HYSTERECTOMY, BILATERAL SALPINGECTOMY: stable  Plan: Advance diet Discontinue IV fluids Discharge home  After breakfast. F/U 2 WK. D/C MEDS: PERCOCET, mOTRIN. D/C INSTRUCTIONS REVIEWED  LOS: 1 day    Hetal Proano A, MD 04/09/2011 7:21 AM    04/09/2011, 7:21 AM

## 2011-04-09 NOTE — Discharge Summary (Signed)
Physician Discharge Summary  Patient ID: Helen Guerrero MRN: [id number] DOB/AGE: Feb 25, 1976 35 y.o.  Admit date: 04/08/2011 Discharge date: 04/09/2011  Admission Diagnoses: DUB, morbid obesity, NIDDM  Discharge Diagnoses: same Principal Problem:  *DUB (dysfunctional uterine bleeding) Active Problems:  Status post hysterectomy   Discharged Condition:stable  Hospital Course: uncomplicated postop course. Pt underwent DaVinci Total hysterectomy, bilateral salpingectomy  Consults: none Significant Diagnostic Studies: noneTreatments: surgery: DaVinci total hysterectomy, bilateral salpingectomy  Discharge Exam: Blood pressure 125/85, pulse 77, temperature 98 F (36.7 C), temperature source Oral, resp. rate 20, height 5' (1.524 m), weight 110.224 kg (243 lb), last menstrual period 02/08/2011, SpO2 97.00%. General appearance: alert, cooperative and no distress Resp: clear to auscultation bilaterally Cardio: regular rate and rhythm, S1, S2 normal, no murmur, click, rub or gallop GI: soft, non-tender; bowel sounds normal; no masses,  no organomegaly Pelvic: deferred Extremities: no edema, redness or tenderness in the calves or thighs Incision/Wound:well approximated, no erythema.induration or exudate  Disposition: Home or Self Care  Discharge Orders    Future Orders Please Complete By Expires   Diet - low sodium heart healthy      Increase activity slowly      Discharge instructions      Comments:   Call if temperature greater than equal to 100.4, nothing per vagina for 4-6 weeks or severe nausea vomiting, increased incisional pain , drainage or redness in the incision site, no straining with bowel movements, showers no bath no lifting x 2 weeks, no driving x 2 wk   No wound care        Current Discharge Medication List    START taking these medications   Details  ibuprofen (ADVIL,MOTRIN) 800 MG tablet Take 1 tablet (800 mg total) by mouth every 8 (eight) hours as needed for  pain (mild pain). Qty: 30 tablet, Refills: 3    oxyCODONE-acetaminophen (PERCOCET) 5-325 MG per tablet Take 1-2 tablets by mouth every 4 (four) hours as needed (moderate to severe pain (when tolerating fluids)). Qty: 30 tablet, Refills: 0      CONTINUE these medications which have NOT CHANGED   Details  glimepiride (AMARYL) 1 MG tablet Take 1 mg by mouth every morning.        Follow-up Information    Follow up with Tymeir Weathington A, MD in 2 weeks.   Contact information:   [address] [place] [address] [phone]          Signed: Serita Kyle 04/09/2011, 7:30 AM

## 2012-05-14 ENCOUNTER — Emergency Department (HOSPITAL_COMMUNITY)
Admission: EM | Admit: 2012-05-14 | Discharge: 2012-05-14 | Disposition: A | Discharge status: Home / Self Care | Payer: Self-pay | Attending: Emergency Medicine | Admitting: Emergency Medicine

## 2012-05-14 ENCOUNTER — Encounter (HOSPITAL_COMMUNITY): Payer: Self-pay | Admitting: Emergency Medicine

## 2012-05-14 DIAGNOSIS — E119 Type 2 diabetes mellitus without complications: Secondary | ICD-10-CM | POA: Insufficient documentation

## 2012-05-14 DIAGNOSIS — Z8742 Personal history of other diseases of the female genital tract: Secondary | ICD-10-CM | POA: Insufficient documentation

## 2012-05-14 DIAGNOSIS — H53149 Visual discomfort, unspecified: Secondary | ICD-10-CM | POA: Insufficient documentation

## 2012-05-14 DIAGNOSIS — R110 Nausea: Secondary | ICD-10-CM | POA: Insufficient documentation

## 2012-05-14 DIAGNOSIS — G43909 Migraine, unspecified, not intractable, without status migrainosus: Secondary | ICD-10-CM | POA: Insufficient documentation

## 2012-05-14 DIAGNOSIS — Z90710 Acquired absence of both cervix and uterus: Secondary | ICD-10-CM | POA: Insufficient documentation

## 2012-05-14 DIAGNOSIS — I10 Essential (primary) hypertension: Secondary | ICD-10-CM | POA: Insufficient documentation

## 2012-05-14 LAB — CBC WITH DIFFERENTIAL/PLATELET
Basophils Absolute: 0 10*3/uL (ref 0.0–0.1)
HCT: 41.5 % (ref 36.0–46.0)
Lymphocytes Relative: 32 % (ref 12–46)
Neutro Abs: 5.5 10*3/uL (ref 1.7–7.7)
Platelets: 375 10*3/uL (ref 150–400)
RBC: 5.16 MIL/uL — ABNORMAL HIGH (ref 3.87–5.11)
RDW: 13.8 % (ref 11.5–15.5)
WBC: 9.1 10*3/uL (ref 4.0–10.5)

## 2012-05-14 LAB — BASIC METABOLIC PANEL
CO2: 29 mEq/L (ref 19–32)
Chloride: 97 mEq/L (ref 96–112)
Potassium: 3.8 mEq/L (ref 3.5–5.1)
Sodium: 136 mEq/L (ref 135–145)

## 2012-05-14 LAB — URINALYSIS, ROUTINE W REFLEX MICROSCOPIC
Glucose, UA: NEGATIVE mg/dL
Leukocytes, UA: NEGATIVE
Protein, ur: NEGATIVE mg/dL
Specific Gravity, Urine: 1.02 (ref 1.005–1.030)
Urobilinogen, UA: 0.2 mg/dL (ref 0.0–1.0)

## 2012-05-14 LAB — URINE MICROSCOPIC-ADD ON

## 2012-05-14 MED ORDER — SODIUM CHLORIDE 0.9 % IV BOLUS (SEPSIS)
1000.0000 mL | Freq: Once | INTRAVENOUS | Status: AC
  Administered 2012-05-14: 1000 mL via INTRAVENOUS

## 2012-05-14 MED ORDER — IBUPROFEN 600 MG PO TABS: 600.0000 mg | ORAL_TABLET | Freq: Four times a day (QID) | ORAL | Status: DC | PRN

## 2012-05-14 MED ORDER — HYDROCODONE-ACETAMINOPHEN 5-325 MG PO TABS: 1.0000 | ORAL_TABLET | Freq: Four times a day (QID) | ORAL | Status: DC | PRN

## 2012-05-14 MED ORDER — PROCHLORPERAZINE EDISYLATE 5 MG/ML IJ SOLN
10.0000 mg | Freq: Four times a day (QID) | INTRAMUSCULAR | Status: DC | PRN
  Administered 2012-05-14: 10 mg via INTRAVENOUS
  Filled 2012-05-14: qty 2

## 2012-05-14 MED ORDER — KETOROLAC TROMETHAMINE 30 MG/ML IJ SOLN
30.0000 mg | Freq: Once | INTRAMUSCULAR | Status: AC
  Administered 2012-05-14: 30 mg via INTRAVENOUS
  Filled 2012-05-14: qty 1

## 2012-05-14 NOTE — ED Provider Notes (Signed)
History     CSN: [id number]  Arrival date & time 05/14/12  0901   First MD Initiated Contact with Patient 05/14/12 [phone]      Chief Complaint  Patient presents with  . Migraine  . Hypertension    (Consider location/radiation/quality/duration/timing/severity/associated sxs/prior treatment) HPI Comments: Pt comes in with cc of headaches. Pt has hx of migraine type headaches, similar to current headaches, but they are in well control with OTC meds. Pt's current pain started last night, and is sharp, throbbing, right sided, with photophobia and nausea but no emesis. Pt has fam hx of migraines. No fevers, chills, neck pain, stiffness.  Patient is a 37 y.o. female presenting with migraines and hypertension. The history is provided by the patient.  Migraine Associated symptoms include headaches. Pertinent negatives include no chest pain, no abdominal pain and no shortness of breath.  Hypertension Associated symptoms include headaches. Pertinent negatives include no chest pain, no abdominal pain and no shortness of breath.    Past Medical History  Diagnosis Date  . Diabetes mellitus     niddm  . Sleep apnea      mild, sleep apnea, no cpap recommended after sleep study 4-5 yrs ago  . Polycystic ovarian disease 2003  . PONV (postoperative nausea and vomiting)     Past Surgical History  Procedure Date  . Uterine polyp 2010  . Breast reduction bilateral 1992  . Robotic assisted lap vaginal hysterectomy 04/08/2011    Procedure: ROBOTIC ASSISTED LAPAROSCOPIC VAGINAL HYSTERECTOMY;  Surgeon: Serita Kyle, MD;  Location: [facility];  Service: Gynecology;  Laterality: N/A;    No family history on file.  History  Substance Use Topics  . Smoking status: Never Smoker   . Smokeless tobacco: Never Used  . Alcohol Use: No    OB History    Grav Para Term Preterm Abortions TAB SAB Ect Mult Living                  Review of Systems  Constitutional: Positive for activity change.    HENT: Negative for neck pain.   Respiratory: Negative for shortness of breath.   Cardiovascular: Negative for chest pain.  Gastrointestinal: Negative for nausea, vomiting and abdominal pain.  Genitourinary: Negative for dysuria.  Neurological: Positive for headaches.    Allergies  Metoclopramide hcl and Penicillins  Home Medications   Current Outpatient Rx  Name  Route  Sig  Dispense  Refill  . CETIRIZINE HCL 10 MG PO TABS   Oral   Take 10 mg by mouth daily.         Marland Kitchen METFORMIN HCL 500 MG PO TABS   Oral   Take 500 mg by mouth daily with breakfast.         . HYDROCODONE-ACETAMINOPHEN 5-325 MG PO TABS   Oral   Take 1 tablet by mouth every 6 (six) hours as needed for pain.   6 tablet   0   . IBUPROFEN 600 MG PO TABS   Oral   Take 1 tablet (600 mg total) by mouth every 6 (six) hours as needed for pain.   30 tablet   0     BP 156/99  Pulse 79  Temp 98.6 F (37 C) (Oral)  Resp 16  Ht 5\' 2"  (1.575 m)  Wt 250 lb (113.399 kg)  BMI 45.73 kg/m2  SpO2 98%  LMP 02/08/2011  Physical Exam  Nursing note and vitals reviewed. Constitutional: She is oriented to person, place, and time.  She appears well-developed and well-nourished.  HENT:  Head: Normocephalic and atraumatic.  Eyes: EOM are normal. Pupils are equal, round, and reactive to light.  Neck: Neck supple.  Cardiovascular: Normal rate, regular rhythm and normal heart sounds.   No murmur heard. Pulmonary/Chest: Effort normal. No respiratory distress.  Abdominal: Soft. She exhibits no distension. There is no tenderness. There is no rebound and no guarding.  Neurological: She is alert and oriented to person, place, and time.  Skin: Skin is warm and dry.    ED Course  Procedures (including critical care time)  Labs Reviewed  URINALYSIS, ROUTINE W REFLEX MICROSCOPIC - Abnormal; Notable for the following:    APPearance CLOUDY (*)     Nitrite POSITIVE (*)     All other components within normal limits  CBC  WITH DIFFERENTIAL - Abnormal; Notable for the following:    RBC 5.16 (*)     All other components within normal limits  BASIC METABOLIC PANEL - Abnormal; Notable for the following:    Glucose, Bld 111 (*)     All other components within normal limits  URINE MICROSCOPIC-ADD ON - Abnormal; Notable for the following:    Squamous Epithelial / LPF FEW (*)     Bacteria, UA MANY (*)     All other components within normal limits   No results found.   1. Migraine       MDM  DDX includes: Primary headaches - including migrainous headaches, cluster headaches, tension headaches. ICH Carotid dissection Cavernous sinus thrombosis Meningitis Encephalitis Sinusitis Tumor Vascular headaches AV malformation Brain aneurysm Muscular headaches  A/P: Pt comes in with cc of headaches. No concerns for life threatening secondary headaches because she has had similar headaches in the past, and these are not the worst headaches of her life. Will give some iv fluids, toradol, compazine and reassess - if better, she will be discharged. We have requested PCP f/u for optimal care.   Derwood Kaplan, MD 05/14/12 1758

## 2012-05-14 NOTE — ED Notes (Signed)
Pt called out and reported that she "could not breathe". Found pt standing beside bed shaking and saying" I don't feel right." Pt vs WNL. Notified Dr. Rhunette Croft who assessed pt and found her to be stable. Pt feels better at this time, no wheezing and vss. Will monitor. Pt husband at bedside

## 2012-05-14 NOTE — ED Notes (Signed)
Pt woke from sleep this morning around 0200 w/ severe headache all over head, emesis at that time x 4. Took BC Powder w/o relief. Last emesis at 0800

## 2012-05-14 NOTE — ED Notes (Signed)
RN to obtain labs with start of IV 

## 2012-12-01 ENCOUNTER — Emergency Department (HOSPITAL_COMMUNITY)
Admission: EM | Admit: 2012-12-01 | Discharge: 2012-12-01 | Disposition: A | Discharge status: Home / Self Care | Payer: Self-pay | Attending: Emergency Medicine | Admitting: Emergency Medicine

## 2012-12-01 ENCOUNTER — Encounter (HOSPITAL_COMMUNITY): Payer: Self-pay | Admitting: Emergency Medicine

## 2012-12-01 DIAGNOSIS — Y939 Activity, unspecified: Secondary | ICD-10-CM | POA: Insufficient documentation

## 2012-12-01 DIAGNOSIS — Z79899 Other long term (current) drug therapy: Secondary | ICD-10-CM | POA: Insufficient documentation

## 2012-12-01 DIAGNOSIS — Z8742 Personal history of other diseases of the female genital tract: Secondary | ICD-10-CM | POA: Insufficient documentation

## 2012-12-01 DIAGNOSIS — M62838 Other muscle spasm: Secondary | ICD-10-CM | POA: Insufficient documentation

## 2012-12-01 DIAGNOSIS — E119 Type 2 diabetes mellitus without complications: Secondary | ICD-10-CM | POA: Insufficient documentation

## 2012-12-01 DIAGNOSIS — Z880 Allergy status to penicillin: Secondary | ICD-10-CM | POA: Insufficient documentation

## 2012-12-01 DIAGNOSIS — Y929 Unspecified place or not applicable: Secondary | ICD-10-CM | POA: Insufficient documentation

## 2012-12-01 DIAGNOSIS — X58XXXA Exposure to other specified factors, initial encounter: Secondary | ICD-10-CM | POA: Insufficient documentation

## 2012-12-01 DIAGNOSIS — S0990XA Unspecified injury of head, initial encounter: Secondary | ICD-10-CM | POA: Insufficient documentation

## 2012-12-01 DIAGNOSIS — S139XXA Sprain of joints and ligaments of unspecified parts of neck, initial encounter: Secondary | ICD-10-CM | POA: Insufficient documentation

## 2012-12-01 DIAGNOSIS — S161XXA Strain of muscle, fascia and tendon at neck level, initial encounter: Secondary | ICD-10-CM

## 2012-12-01 MED ORDER — DIAZEPAM 5 MG PO TABS: 5.0000 mg | ORAL_TABLET | Freq: Two times a day (BID) | ORAL | Status: DC

## 2012-12-01 NOTE — ED Notes (Signed)
Lt side numbness since tues, headache, no hx of htn, no slurres speech, alert x4.

## 2012-12-01 NOTE — ED Provider Notes (Signed)
CSN: [id number]     Arrival date & time 12/01/12  1329 History     First MD Initiated Contact with Patient 12/01/12 1346     Chief Complaint  Patient presents with  . left sided numbness    (Consider location/radiation/quality/duration/timing/severity/associated sxs/prior Treatment) HPI Comments: 38 y/o female with a PMHx of DM, PCOS and sleep apnea presents to the ED complaining of right sided neck pain x 4 days. Patient states 5 days ago she had tingling on the right side of her face that lasted a few hours, no numbness, subsided on its own. The next day she developed achy left sided neck pain radiating up to the back of her head and left shoulder causing a headache. Took ibuprofen with temporary relief of her symptoms. Denies fever, chills, photophobia, visual changes, neck stiffness. No known injury or trauma. 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 Denies being under increased stress.  The history is provided by the patient.    Past Medical History  Diagnosis Date  . Diabetes mellitus     niddm  . Sleep apnea      mild, sleep apnea, no cpap recommended after sleep study 4-5 yrs ago  . Polycystic ovarian disease 2003  . PONV (postoperative nausea and vomiting)    Past Surgical History  Procedure Laterality Date  . Uterine polyp  2010  . Breast reduction bilateral  1992  . Robotic assisted lap vaginal hysterectomy  04/08/2011    Procedure: ROBOTIC ASSISTED LAPAROSCOPIC VAGINAL HYSTERECTOMY;  Surgeon: Serita Kyle, MD;  Location: [facility];  Service: Gynecology;  Laterality: N/A;   No family history on file. History  Substance Use Topics  . Smoking status: Never Smoker   . Smokeless tobacco: Never Used  . Alcohol Use: No   OB History   Grav Para Term Preterm Abortions TAB SAB Ect Mult Living                 Review of Systems  Constitutional: Negative for fever, chills and activity change.  Eyes: Negative for photophobia and visual disturbance.    Musculoskeletal: Negative for back pain.  Skin: Negative for color change.  Neurological: Positive for headaches. Negative for dizziness, weakness and light-headedness.       Positive for left sided facial tingling.  All other systems reviewed and are negative.    Allergies  Metoclopramide hcl and Penicillins  Home Medications   Current Outpatient Rx  Name  Route  Sig  Dispense  Refill  . cetirizine (ZYRTEC) 10 MG tablet   Oral   Take 10 mg by mouth daily.         . diazepam (VALIUM) 5 MG tablet   Oral   Take 1 tablet (5 mg total) by mouth 2 (two) times daily.   10 tablet   0   . HYDROcodone-acetaminophen (NORCO/VICODIN) 5-325 MG per tablet   Oral   Take 1 tablet by mouth every 6 (six) hours as needed for pain.   6 tablet   0   . ibuprofen (ADVIL,MOTRIN) 600 MG tablet   Oral   Take 1 tablet (600 mg total) by mouth every 6 (six) hours as needed for pain.   30 tablet   0   . metFORMIN (GLUCOPHAGE) 500 MG tablet   Oral   Take 500 mg by mouth daily with breakfast.          BP 138/86  Pulse 89  Temp(Src) 98.4 F (36.9 C) (Oral)  Resp 18  SpO2 97%  LMP 02/08/2011 Physical Exam  Nursing note and vitals reviewed. Constitutional: She is oriented to person, place, and time. She appears well-developed and well-nourished. No distress.  HENT:  Head: Normocephalic and atraumatic.  Mouth/Throat: Oropharynx is clear and moist.  Eyes: Conjunctivae and EOM are normal. Pupils are equal, round, and reactive to light.  Neck: Normal range of motion. Neck supple. No spinous process tenderness present. No rigidity. No edema, no erythema and normal range of motion present.    Cardiovascular: Normal rate, regular rhythm and normal heart sounds.   Pulmonary/Chest: Effort normal and breath sounds normal.  Musculoskeletal: Normal range of motion. She exhibits no edema.  Neurological: She is alert and oriented to person, place, and time. She has normal strength. No cranial nerve  deficit or sensory deficit. Coordination normal.  Skin: Skin is warm, dry and intact. She is not diaphoretic.  Psychiatric: She has a normal mood and affect. Her behavior is normal.    ED Course   Procedures (including critical care time)  Labs Reviewed - No data to display No results found. 1. Neck strain, initial encounter   2. Muscle spasm     MDM  Patient with right sided muscle spasm, neck strain. No red flags concerning patient's neck pain or headache. No focal neuro deficits, full ROM, no edema. Afebrile, normal vital signs. Discharge with valium for muscle spasm. Advised heat, ibuprofen. Return precautions discussed. Patient states understanding of plan and is agreeable.   Trevor Mace, PA-C 12/01/12 1417

## 2012-12-01 NOTE — ED Notes (Addendum)
Per pt states she woke up with left facial numbness on Monday, states it would come and go throughout day-states started having headache on wed-states facial numbness resolved on Tuesday-states BF notice she was "zoned out" when symptoms occurred

## 2012-12-04 NOTE — ED Provider Notes (Signed)
Medical screening examination/treatment/procedure(s) were performed by non-physician practitioner and as supervising physician I was immediately available for consultation/collaboration.   Devarion Mcclanahan, MD 12/04/12 0703 

## 2013-03-15 ENCOUNTER — Emergency Department (HOSPITAL_COMMUNITY): Payer: Self-pay

## 2013-03-15 ENCOUNTER — Emergency Department (HOSPITAL_COMMUNITY)
Admission: EM | Admit: 2013-03-15 | Discharge: 2013-03-15 | Disposition: A | Discharge status: Home / Self Care | Payer: Self-pay | Attending: Emergency Medicine | Admitting: Emergency Medicine

## 2013-03-15 ENCOUNTER — Encounter (HOSPITAL_COMMUNITY): Payer: Self-pay | Admitting: Emergency Medicine

## 2013-03-15 DIAGNOSIS — R209 Unspecified disturbances of skin sensation: Secondary | ICD-10-CM | POA: Insufficient documentation

## 2013-03-15 DIAGNOSIS — E119 Type 2 diabetes mellitus without complications: Secondary | ICD-10-CM | POA: Insufficient documentation

## 2013-03-15 DIAGNOSIS — R200 Anesthesia of skin: Secondary | ICD-10-CM

## 2013-03-15 DIAGNOSIS — Z8742 Personal history of other diseases of the female genital tract: Secondary | ICD-10-CM | POA: Insufficient documentation

## 2013-03-15 DIAGNOSIS — Z880 Allergy status to penicillin: Secondary | ICD-10-CM | POA: Insufficient documentation

## 2013-03-15 DIAGNOSIS — R51 Headache: Secondary | ICD-10-CM | POA: Insufficient documentation

## 2013-03-15 LAB — CBC WITH DIFFERENTIAL/PLATELET
Basophils Relative: 0 % (ref 0–1)
Hemoglobin: 13.3 g/dL (ref 12.0–15.0)
Lymphs Abs: 4.1 10*3/uL — ABNORMAL HIGH (ref 0.7–4.0)
MCHC: 33.3 g/dL (ref 30.0–36.0)
Monocytes Relative: 8 % (ref 3–12)
Neutro Abs: 6.6 10*3/uL (ref 1.7–7.7)
Neutrophils Relative %: 56 % (ref 43–77)
RBC: 4.95 MIL/uL (ref 3.87–5.11)
WBC: 11.9 10*3/uL — ABNORMAL HIGH (ref 4.0–10.5)

## 2013-03-15 LAB — BASIC METABOLIC PANEL
BUN: 13 mg/dL (ref 6–23)
Chloride: 100 mEq/L (ref 96–112)
GFR calc Af Amer: >90 mL/min (ref >90)
Potassium: 3.9 mEq/L (ref 3.5–5.1)

## 2013-03-15 NOTE — ED Provider Notes (Signed)
CSN: [id number]     Arrival date & time 03/15/13  1634 History   First MD Initiated Contact with Patient 03/15/13 1706     Chief Complaint  Patient presents with  . tingling face    (Consider location/radiation/quality/duration/timing/severity/associated sxs/prior Treatment) HPI Comments: Patient is a 37 year old female with history of diabetes, sleep apnea, polycystic ovarian disease who presents today with 2 weeks of constant, sudden onset left facial numbness. This has remained the same for the past 2 weeks. She describes it as more of a tingling sensation. She has no trouble eating or drinking. She is no couple keeping her eyes closed. She denies any weakness. Sometimes she does have radiating numbness into her left arm. This has happened to her and the past and was told she had a muscle strain. When this happened to her in the past it only lasted one week and resolved spontaneously. She also reports that she had a sudden onset 7/10 throbbing diffuse headache when she woke up this morning. This headache lasted for 3 hours and has began to resolve. She denies any blurry vision, photophobia, diplopia. She does note that she has black spots intermittently that lasts approximately 5 minutes when they come on. She is not experiencing these black spots now. The history is provided by the patient. No language interpreter was used.    Past Medical History  Diagnosis Date  . Diabetes mellitus     niddm  . Sleep apnea      mild, sleep apnea, no cpap recommended after sleep study 4-5 yrs ago  . Polycystic ovarian disease 2003  . PONV (postoperative nausea and vomiting)    Past Surgical History  Procedure Laterality Date  . Uterine polyp  2010  . Breast reduction bilateral  1992  . Robotic assisted lap vaginal hysterectomy  04/08/2011    Procedure: ROBOTIC ASSISTED LAPAROSCOPIC VAGINAL HYSTERECTOMY;  Surgeon: Serita Kyle, MD;  Location: [facility];  Service: Gynecology;  Laterality: N/A;    Family History  Problem Relation Age of Onset  . Hypertension Mother   . Hyperlipidemia Mother   . Heart failure Mother   . Diabetes Mother   . Cancer Mother   . Aneurysm Mother   . Cancer Father   . Diabetes Father   . COPD Father   . Diabetes Sister    History  Substance Use Topics  . Smoking status: Never Smoker   . Smokeless tobacco: Never Used  . Alcohol Use: No   OB History   Grav Para Term Preterm Abortions TAB SAB Ect Mult Living                 Review of Systems  Constitutional: Negative for fever and chills.  Eyes: Negative for photophobia.  Respiratory: Negative for shortness of breath.   Cardiovascular: Negative for chest pain.  Gastrointestinal: Negative for nausea, vomiting and abdominal pain.  Neurological: Positive for numbness and headaches. Negative for weakness.  All other systems reviewed and are negative.    Allergies  Metoclopramide hcl and Penicillins  Home Medications  No current outpatient prescriptions on file. BP 163/108  Pulse 84  Temp(Src) 98.5 F (36.9 C) (Oral)  Resp 20  Ht 5\' 2"  (1.575 m)  Wt 240 lb (108.863 kg)  BMI 43.89 kg/m2  SpO2 100%  LMP 02/08/2011 Physical Exam  Nursing note and vitals reviewed. Constitutional: She is oriented to person, place, and time. She appears well-developed and well-nourished. No distress.  HENT:  Head: Normocephalic and atraumatic.  Right Ear: Tympanic membrane, external ear and ear canal normal.  Left Ear: Tympanic membrane, external ear and ear canal normal.  Nose: Nose normal.  Mouth/Throat: Oropharynx is clear and moist.  Eyes: Conjunctivae are normal.  Neck: Normal range of motion.  Cardiovascular: Normal rate, regular rhythm, normal heart sounds, intact distal pulses and normal pulses.   Pulmonary/Chest: Effort normal and breath sounds normal. No stridor. No respiratory distress. She has no wheezes. She has no rales.  Abdominal: Soft. She exhibits no distension.  Musculoskeletal:  Normal range of motion.  Neurological: She is alert and oriented to person, place, and time. She has normal strength. No cranial nerve deficit (III-XII) or sensory deficit. She exhibits normal muscle tone. Coordination and gait normal.  Finger-nose-finger, rapid alternating movements, heel-knee-shin normal. She is able to differentiate between sharp and dull on her left face. No facial droop. No pronator drift. No facial asymmetry. She is able to keep both eyes closed against resistance. Gait is not ataxic. Grip strength 5 out of 5 bilaterally.  Skin: Skin is warm and dry. She is not diaphoretic. No erythema.  Psychiatric: She has a normal mood and affect. Her behavior is normal.    ED Course  Procedures (including critical care time) Labs Review Labs Reviewed  CBC WITH DIFFERENTIAL - Abnormal; Notable for the following:    WBC 11.9 (*)    Lymphs Abs 4.1 (*)    All other components within normal limits  BASIC METABOLIC PANEL - Abnormal; Notable for the following:    Sodium 134 (*)    All other components within normal limits   Imaging Review Ct Head Wo Contrast  03/15/2013   CLINICAL DATA:  37-year- female with left side face and arm tingling and headache. Severe headache. Initial encounter.  EXAM: CT HEAD WITHOUT CONTRAST  TECHNIQUE: Contiguous axial images were obtained from the base of the skull through the vertex without intravenous contrast.  COMPARISON:  07/15/2006.  FINDINGS: Bubbly opacity in the left sphenoid sinus. Mild ethmoid sinus mucosal thickening. Other Visualized paranasal sinuses and mastoids are clear. Visualized orbits and scalp soft tissues are within normal limits. No acute osseous abnormality identified.  Stable and normal cerebral volume. No midline shift, ventriculomegaly, mass effect, evidence of mass lesion, intracranial hemorrhage or evidence of cortically based acute infarction. Gray-white matter differentiation is within normal limits throughout the brain. 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 No suspicious intracranial vascular hyperdensity.  IMPRESSION: 1. Stable and Normal noncontrast CT appearance of the brain.  2. Mild to moderate ethmoid and sphenoid sinus inflammatory changes compatible with sinusitis.   Electronically Signed   By: Augusto Gamble M.D.   On: 03/15/2013 18:49    EKG Interpretation   None       MDM   1. Numbness and tingling    Patient with numbness and tingling to left side of face. Neuro exam is within normal limits. Head CT negative for acute pathology. Labs are within normal limits. There is a small white count which is likely due to stress. She has a primary care physician or she can followup with. No weakness. No concern for Bell's palsy, stroke. I discussed this case with Dr. Estell Harpin who agrees with plan. Return instructions given. Vital signs stable for discharge. Patient / Family / Caregiver informed of clinical course, understand medical decision-making process, and agree with plan.     Mora Bellman, PA-C  03/15/13 2348 

## 2013-03-15 NOTE — ED Notes (Signed)
Patient reports that she has tingling of her left face and left arm and a headache all over. Patient states she had same symptoms in July/2014 minus the headache and was told that she had muscle strain. Patient states she has been "seeing little black dots." MAE. No hand drift. Face symmetrical, strong bilateral hand grips, tongue midline, able to raise both legs off of the stretcher.

## 2013-03-16 NOTE — ED Provider Notes (Signed)
Medical screening examination/treatment/procedure(s) were performed by non-physician practitioner and as supervising physician I was immediately available for consultation/collaboration.  EKG Interpretation   None         Ange Puskas L Brandonlee Navis, MD 03/16/13 1544 

## 2013-12-15 ENCOUNTER — Encounter (HOSPITAL_COMMUNITY): Payer: Self-pay | Admitting: Emergency Medicine

## 2013-12-15 ENCOUNTER — Emergency Department (HOSPITAL_COMMUNITY)
Admission: EM | Admit: 2013-12-15 | Discharge: 2013-12-15 | Disposition: A | Discharge status: Home / Self Care | Payer: No Typology Code available for payment source | Attending: Emergency Medicine | Admitting: Emergency Medicine

## 2013-12-15 DIAGNOSIS — Z880 Allergy status to penicillin: Secondary | ICD-10-CM | POA: Insufficient documentation

## 2013-12-15 DIAGNOSIS — Z8742 Personal history of other diseases of the female genital tract: Secondary | ICD-10-CM | POA: Insufficient documentation

## 2013-12-15 DIAGNOSIS — L509 Urticaria, unspecified: Secondary | ICD-10-CM

## 2013-12-15 DIAGNOSIS — L500 Allergic urticaria: Secondary | ICD-10-CM | POA: Insufficient documentation

## 2013-12-15 DIAGNOSIS — E119 Type 2 diabetes mellitus without complications: Secondary | ICD-10-CM | POA: Insufficient documentation

## 2013-12-15 MED ORDER — PREDNISONE 20 MG PO TABS
60.0000 mg | ORAL_TABLET | Freq: Once | ORAL | Status: AC
  Administered 2013-12-15: 60 mg via ORAL
  Filled 2013-12-15: qty 3

## 2013-12-15 MED ORDER — FAMOTIDINE 20 MG PO TABS
20.0000 mg | ORAL_TABLET | Freq: Once | ORAL | Status: AC
  Administered 2013-12-15: 20 mg via ORAL
  Filled 2013-12-15: qty 1

## 2013-12-15 MED ORDER — PREDNISONE 20 MG PO TABS: ORAL_TABLET | ORAL | Status: AC

## 2013-12-15 MED ORDER — FAMOTIDINE 20 MG PO TABS: 20.0000 mg | ORAL_TABLET | Freq: Two times a day (BID) | ORAL | Status: AC

## 2013-12-15 MED ORDER — DIPHENHYDRAMINE HCL 25 MG PO CAPS
50.0000 mg | ORAL_CAPSULE | Freq: Once | ORAL | Status: AC
  Administered 2013-12-15: 50 mg via ORAL
  Filled 2013-12-15: qty 2

## 2013-12-15 MED ORDER — DIPHENHYDRAMINE HCL 25 MG PO TABS: 25.0000 mg | ORAL_TABLET | Freq: Four times a day (QID) | ORAL | Status: AC

## 2013-12-15 NOTE — ED Provider Notes (Signed)
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  On exam mild rash to above mentioned areas - is urticarial, clear lungs, no wheezign, OP clear, pt counseled on further testing, prednisone as oupt.  Medical screening examination/treatment/procedure(s) were conducted as a shared visit with non-physician practitioner(s) and myself.  I personally evaluated the patient during the encounter.  Clinical Impression: Allergic reaction      Vida RollerBrian D Odalis Jordan, MD 12/15/13 [phone]

## 2013-12-15 NOTE — ED Provider Notes (Signed)
CSN: [id number]     Arrival date & time 12/15/13  0253 History   First MD Initiated Contact with Patient 12/15/13 0259     Chief Complaint  Patient presents with  . Urticaria     (Consider location/radiation/quality/duration/timing/severity/associated sxs/prior Treatment) HPI  38 year old female with history of diabetes, polycystic ovarian disease who presents for evaluations of urticaria. Patient states approximately 3 hours ago when she finished showering she noticed hives to her bilateral inner thigh which she described as a rash and itchiness. She also noticed hives to her anterior chest. Symptom has been persistent, not improved with taking anti-itch cream. She denies any associated fever, headache, throat swelling, trouble swallowing, chest pain, shortness of breath, abdominal pain, nausea vomiting or diarrhea. There has been no recent medication changes, new pets, change in soap or detergents or any other environmental changes. She has a similar rash approximately 3 days prior with no known exposure. She was seen at urgent care and report receiving a shot which has helped.  At this time patient has no other complaints.   Past Medical History  Diagnosis Date  . Diabetes mellitus     niddm  . Sleep apnea      mild, sleep apnea, no cpap recommended after sleep study 4-5 yrs ago  . Polycystic ovarian disease 2003  . PONV (postoperative nausea and vomiting)    Past Surgical History  Procedure Laterality Date  . Uterine polyp  2010  . Breast reduction bilateral  1992  . Robotic assisted lap vaginal hysterectomy  04/08/2011    Procedure: ROBOTIC ASSISTED LAPAROSCOPIC VAGINAL HYSTERECTOMY;  Surgeon: Serita KyleSheronette A Cousins, MD;  Location: [facility];  Service: Gynecology;  Laterality: N/A;   Family History  Problem Relation Age of Onset  . Hypertension Mother   . Hyperlipidemia Mother   . Heart failure Mother   . Diabetes Mother   . Cancer Mother   . Aneurysm Mother   . Cancer Father    . Diabetes Father   . COPD Father   . Diabetes Sister    History  Substance Use Topics  . Smoking status: Never Smoker   . Smokeless tobacco: Never Used  . Alcohol Use: No   OB History   Grav Para Term Preterm Abortions TAB SAB Ect Mult Living                 Review of Systems  All other systems reviewed and are negative.     Allergies  Metoclopramide hcl and Penicillins  Home Medications   Prior to Admission medications   Not on File   LMP 02/08/2011 Physical Exam  Nursing note and vitals reviewed. Constitutional: She appears well-developed and well-nourished. No distress.  HENT:  Head: Atraumatic.  Mouth/Throat: Oropharynx is clear and moist.  Eyes: Conjunctivae are normal.  Neck: Normal range of motion. Neck supple. No tracheal deviation present.  Cardiovascular: Normal rate and regular rhythm.   Pulmonary/Chest: Effort normal and breath sounds normal. No respiratory distress. She has no wheezes.  Abdominal: Soft. There is no tenderness.  Neurological: She is alert.  Skin: Rash (urticaria rash noted to anterior chest, and bilateral inner thigh without any signs of cellulitis or infection.) noted.  Psychiatric: She has a normal mood and affect.    ED Course  Procedures (including critical care time)  3:08 AM Pt with urticarial rash, unknown exposure or irritant.  No mucosal involvement, no airway involvement.  Pt is stable.  Prednisone/benadryl/pepcid given.  Will monitor.  4:00 AM Urticarial rash has resolved.  Pt felt stable to be discharge.  Pt will be d/c with prednisone/pepcid/benadryl.  Pt made aware prednisone may altered her CBG and to monitor closely.  Return precaution discussed.  Care discussed with Dr. Hyacinth Meeker.    Labs Review Labs Reviewed - No data to display  Imaging Review No results found.   EKG Interpretation None      MDM   Final diagnoses:  Urticaria    BP 170/117  Pulse 86  Temp(Src) 98.2 F (36.8 C) (Oral)  Resp 18   Ht 5\' 2"  (1.575 m)  Wt 238 lb (107.956 kg)  BMI 43.52 kg/m2  SpO2 98%  LMP 02/08/2011 Pt made aware BP is elevated and need to be recheck by PCP.      Fayrene Helper, PA-C 12/15/13 [phone]

## 2013-12-15 NOTE — ED Notes (Signed)
Pt states that she broke out in hives to her arm on Tues; pt states that she was seen at the [facility] and given an shot; pt states that she was fine Wed and Thurs but broke out again this pm after 12 after she was in the shower; pt states that she used anti-itch cream without relief; pt denies difficulty breathing, throat irritation or swelling; pt denies any other sx at this time

## 2013-12-15 NOTE — ED Provider Notes (Signed)
Medical screening examination/treatment/procedure(s) were conducted as a shared visit with non-physician practitioner(s) and myself.  I personally evaluated the patient during the encounter  Please see my separate respective documentation pertaining to this patient encounter   Vida RollerBrian D Jazelyn Sipe, MD 12/15/13 [phone]

## 2013-12-15 NOTE — ED Notes (Signed)
Patient stated she is not itching anymore. She does not know what causes it.

## 2013-12-15 NOTE — Discharge Instructions (Signed)
Take medications as prescribed to treat your allergic reaction.  Prednisone may cause your blood sugar to rise, so please check it appropriately.  Return to ER if you notice fever, throat swelling, trouble breathing, or if you have other concerns.    Hives Hives are itchy, red, swollen areas of the skin. They can vary in size and location on your body. Hives can come and go for hours or several days (acute hives) or for several weeks (chronic hives). Hives do not spread from person to person (noncontagious). They may get worse with scratching, exercise, and emotional stress. CAUSES   Allergic reaction to food, additives, or drugs.  Infections, including the common cold.  Illness, such as vasculitis, lupus, or thyroid disease.  Exposure to sunlight, heat, or cold.  Exercise.  Stress.  Contact with chemicals. SYMPTOMS   Red or white swollen patches on the skin. The patches may change size, shape, and location quickly and repeatedly.  Itching.  Swelling of the hands, feet, and face. This may occur if hives develop deeper in the skin. DIAGNOSIS  Your caregiver can usually tell what is wrong by performing a physical exam. Skin or blood tests may also be done to determine the cause of your hives. In some cases, the cause cannot be determined. TREATMENT  Mild cases usually get better with medicines such as antihistamines. Severe cases may require an emergency epinephrine injection. If the cause of your hives is known, treatment includes avoiding that trigger.  HOME CARE INSTRUCTIONS   Avoid causes that trigger your hives.  Take antihistamines as directed by your caregiver to reduce the severity of your hives. Non-sedating or low-sedating antihistamines are usually recommended. Do not drive while taking an antihistamine.  Take any other medicines prescribed for itching as directed by your caregiver.  Wear loose-fitting clothing.  Keep all follow-up appointments as directed by your  caregiver. SEEK MEDICAL CARE IF:   You have persistent or severe itching that is not relieved with medicine.  You have painful or swollen joints. SEEK IMMEDIATE MEDICAL CARE IF:   You have a fever.  Your tongue or lips are swollen.  You have trouble breathing or swallowing.  You feel tightness in the throat or chest.  You have abdominal pain. These problems may be the first sign of a life-threatening allergic reaction. Call your local emergency services (911 in [place] MAKE SURE YOU:   Understand these instructions.  Will watch your condition.  Will get help right away if you are not doing well or get worse. Document Released: 04/26/2005 Document Revised: 05/01/2013 Document Reviewed: 07/20/2011 [employer] Patient Information 2015 [employer] [place]. This information is not intended to replace advice given to you by your health care provider. Make sure you discuss any questions you have with your health care provider.

## 2014-01-16 ENCOUNTER — Other Ambulatory Visit: Payer: Self-pay

## 2014-01-16 DIAGNOSIS — Z9889 Other specified postprocedural states: Secondary | ICD-10-CM

## 2014-01-16 DIAGNOSIS — Z1231 Encounter for screening mammogram for malignant neoplasm of breast: Secondary | ICD-10-CM

## 2014-02-25 ENCOUNTER — Ambulatory Visit
Admission: RE | Admit: 2014-02-25 | Discharge: 2014-02-25 | Disposition: A | Discharge status: Home / Self Care | Payer: No Typology Code available for payment source | Source: Ambulatory Visit

## 2014-02-25 DIAGNOSIS — Z9889 Other specified postprocedural states: Secondary | ICD-10-CM

## 2014-02-25 DIAGNOSIS — Z1231 Encounter for screening mammogram for malignant neoplasm of breast: Secondary | ICD-10-CM

## 2014-03-18 ENCOUNTER — Emergency Department (HOSPITAL_COMMUNITY)
Admission: EM | Admit: 2014-03-18 | Discharge: 2014-03-18 | Disposition: A | Discharge status: Home / Self Care | Payer: No Typology Code available for payment source | Attending: Emergency Medicine | Admitting: Emergency Medicine

## 2014-03-18 ENCOUNTER — Encounter (HOSPITAL_COMMUNITY): Payer: Self-pay | Admitting: Oncology

## 2014-03-18 DIAGNOSIS — G43909 Migraine, unspecified, not intractable, without status migrainosus: Secondary | ICD-10-CM | POA: Diagnosis present

## 2014-03-18 DIAGNOSIS — E119 Type 2 diabetes mellitus without complications: Secondary | ICD-10-CM | POA: Insufficient documentation

## 2014-03-18 DIAGNOSIS — G4730 Sleep apnea, unspecified: Secondary | ICD-10-CM | POA: Insufficient documentation

## 2014-03-18 DIAGNOSIS — Z7952 Long term (current) use of systemic steroids: Secondary | ICD-10-CM | POA: Diagnosis not present

## 2014-03-18 DIAGNOSIS — G43809 Other migraine, not intractable, without status migrainosus: Secondary | ICD-10-CM | POA: Insufficient documentation

## 2014-03-18 DIAGNOSIS — Z79899 Other long term (current) drug therapy: Secondary | ICD-10-CM | POA: Insufficient documentation

## 2014-03-18 DIAGNOSIS — Z880 Allergy status to penicillin: Secondary | ICD-10-CM | POA: Diagnosis not present

## 2014-03-18 HISTORY — DX: Migraine, unspecified, not intractable, without status migrainosus: G43.909

## 2014-03-18 MED ORDER — PROMETHAZINE HCL 25 MG/ML IJ SOLN
12.5000 mg | Freq: Once | INTRAMUSCULAR | Status: AC
  Administered 2014-03-18: 12.5 mg via INTRAVENOUS
  Filled 2014-03-18: qty 1

## 2014-03-18 MED ORDER — DEXAMETHASONE SODIUM PHOSPHATE 10 MG/ML IJ SOLN
10.0000 mg | Freq: Once | INTRAMUSCULAR | Status: AC
  Administered 2014-03-18: 10 mg via INTRAVENOUS
  Filled 2014-03-18: qty 1

## 2014-03-18 MED ORDER — SODIUM CHLORIDE 0.9 % IV BOLUS (SEPSIS)
1000.0000 mL | Freq: Once | INTRAVENOUS | Status: AC
  Administered 2014-03-18: 1000 mL via INTRAVENOUS

## 2014-03-18 MED ORDER — DIPHENHYDRAMINE HCL 50 MG/ML IJ SOLN
25.0000 mg | Freq: Once | INTRAMUSCULAR | Status: AC
  Administered 2014-03-18: 25 mg via INTRAVENOUS
  Filled 2014-03-18: qty 1

## 2014-03-18 NOTE — ED Notes (Signed)
MD at bedside. 

## 2014-03-18 NOTE — Discharge Instructions (Signed)

## 2014-03-18 NOTE — ED Notes (Addendum)
Pt escorted to discharge window. Verbalized understanding discharge instructions. In no acute distress.  Pt provided a school note.

## 2014-03-18 NOTE — ED Provider Notes (Signed)
CSN: [id number]     Arrival date & time 03/18/14  0446 History   First MD Initiated Contact with Patient 03/18/14 [phone]     Chief Complaint  Patient presents with  . Migraine     (Consider location/radiation/quality/duration/timing/severity/associated sxs/prior Treatment) HPI Comments: Patient presents with a migraine. She states it woke her up about 1:30 tonight. She states she took a Topamax without relief. She previously had migraines when she was in her 4920s fairly frequently. She states that this headache feels the same as her past migraines. She recently started having recurrence of her migraines about 2-3 months ago. She states the pain is all over her head. She has some pain in her neck. She states it's the same type of pain she's had before with her migraines. She has no unusual symptoms were atypical symptoms as compared to her normal migraines. She's had some associated nausea vomiting and photophobia. She denies any fevers or chills.   Patient is a 38 y.o. female presenting with migraines.  Migraine Associated symptoms include headaches. Pertinent negatives include no chest pain, no abdominal pain and no shortness of breath.    Past Medical History  Diagnosis Date  . Diabetes mellitus     niddm  . Sleep apnea      mild, sleep apnea, no cpap recommended after sleep study 4-5 yrs ago  . Polycystic ovarian disease 2003  . PONV (postoperative nausea and vomiting)   . Migraines    Past Surgical History  Procedure Laterality Date  . Uterine polyp  2010  . Breast reduction bilateral  1992  . Robotic assisted lap vaginal hysterectomy  04/08/2011    Procedure: ROBOTIC ASSISTED LAPAROSCOPIC VAGINAL HYSTERECTOMY;  Surgeon: Serita KyleSheronette A Cousins, MD;  Location: [facility];  Service: Gynecology;  Laterality: N/A;  . Abdominal hysterectomy     Family History  Problem Relation Age of Onset  . Hypertension Mother   . Hyperlipidemia Mother   . Heart failure Mother   . Diabetes Mother   .  Cancer Mother   . Aneurysm Mother   . Cancer Father   . Diabetes Father   . COPD Father   . Diabetes Sister    History  Substance Use Topics  . Smoking status: Never Smoker   . Smokeless tobacco: Never Used  . Alcohol Use: No   OB History    No data available     Review of Systems  Constitutional: Negative for fever, chills, diaphoresis and fatigue.  HENT: Negative for congestion, rhinorrhea and sneezing.   Eyes: Positive for photophobia.  Respiratory: Negative for cough, chest tightness and shortness of breath.   Cardiovascular: Negative for chest pain and leg swelling.  Gastrointestinal: Positive for nausea and vomiting. Negative for abdominal pain, diarrhea and blood in stool.  Genitourinary: Negative for frequency, hematuria, flank pain and difficulty urinating.  Musculoskeletal: Negative for back pain and arthralgias.  Skin: Negative for rash.  Neurological: Positive for headaches. Negative for dizziness, speech difficulty, weakness and numbness.      Allergies  Metoclopramide hcl; Other; and Penicillins  Home Medications   Prior to Admission medications   Medication Sig Start Date End Date Taking? Authorizing Provider  metFORMIN (GLUCOPHAGE) 500 MG tablet Take 500 mg by mouth daily with breakfast.   Yes Historical Provider, MD  diphenhydrAMINE (BENADRYL) 25 MG tablet Take 1 tablet (25 mg total) by mouth every 6 (six) hours. 12/15/13   Fayrene HelperBowie Tran, PA-C  famotidine (PEPCID) 20 MG tablet Take 1 tablet (20  mg total) by mouth 2 (two) times daily. 12/15/13   Fayrene HelperBowie Tran, PA-C  Naltrexone-Bupropion HCl ER (CONTRAVE) 8-90 MG TB12 Take 4 tablets by mouth 2 (two) times daily.    Historical Provider, MD  predniSONE (DELTASONE) 20 MG tablet 3 tabs po day one, then 2 tabs daily x 4 days 12/15/13   Fayrene HelperBowie Tran, PA-C   BP 145/76 mmHg  Pulse 78  Temp(Src) 97.5 F (36.4 C) (Oral)  Resp 18  Ht 5\' 2"  (1.575 m)  Wt 236 lb (107.049 kg)  BMI 43.15 kg/m2  SpO2 100%  LMP  02/08/2011 Physical Exam  Constitutional: She is oriented to person, place, and time. She appears well-developed and well-nourished.  HENT:  Head: Normocephalic and atraumatic.  Eyes: Pupils are equal, round, and reactive to light.  Neck: Normal range of motion. Neck supple.  No meningismus  Cardiovascular: Normal rate, regular rhythm and normal heart sounds.   Pulmonary/Chest: Effort normal and breath sounds normal. No respiratory distress. She has no wheezes. She has no rales. She exhibits no tenderness.  Abdominal: Soft. Bowel sounds are normal. There is no tenderness. There is no rebound and no guarding.  Musculoskeletal: Normal range of motion. She exhibits no edema.  Lymphadenopathy:    She has no cervical adenopathy.  Neurological: She is alert and oriented to person, place, and time. She has normal strength. No cranial nerve deficit or sensory deficit. GCS eye subscore is 4. GCS verbal subscore is 5. GCS motor subscore is 6.  Finger to nose intact  Skin: Skin is warm and dry. No rash noted.  Psychiatric: She has a normal mood and affect.    ED Course  Procedures (including critical care time) Labs Review Labs Reviewed - No data to display  Imaging Review No results found.   EKG Interpretation None      MDM   Final diagnoses:  Other type of migraine    Patient presents with a migraine headache. She states it feels the same as her past migraines. She denies any unusual symptoms. She has no meningeal symptoms or other suggestions of subarachnoid hemorrhage are more concerning cause of her headache. She is doing much better after meds. She feels like she's really go home. She was advised to follow-up with her primary care physician for ongoing management her migraines or return here for symptoms worsen.    Rolan BuccoMelanie Naz Denunzio, MD 03/18/14 [phone]

## 2014-03-18 NOTE — ED Notes (Signed)
Per pt she woke up at 0130 w/ a migraine.  Pt took Topamax w/o relief.  +photophbia. +nausea/vomiting.

## 2014-04-22 ENCOUNTER — Encounter (HOSPITAL_COMMUNITY): Payer: Self-pay | Admitting: Emergency Medicine

## 2014-04-22 ENCOUNTER — Emergency Department (HOSPITAL_COMMUNITY)
Admission: EM | Admit: 2014-04-22 | Discharge: 2014-04-22 | Disposition: A | Discharge status: Home / Self Care | Payer: No Typology Code available for payment source | Attending: Emergency Medicine | Admitting: Emergency Medicine

## 2014-04-22 DIAGNOSIS — Z79899 Other long term (current) drug therapy: Secondary | ICD-10-CM | POA: Diagnosis not present

## 2014-04-22 DIAGNOSIS — Z8639 Personal history of other endocrine, nutritional and metabolic disease: Secondary | ICD-10-CM | POA: Diagnosis not present

## 2014-04-22 DIAGNOSIS — G43909 Migraine, unspecified, not intractable, without status migrainosus: Secondary | ICD-10-CM | POA: Diagnosis present

## 2014-04-22 DIAGNOSIS — E119 Type 2 diabetes mellitus without complications: Secondary | ICD-10-CM | POA: Diagnosis not present

## 2014-04-22 DIAGNOSIS — Z880 Allergy status to penicillin: Secondary | ICD-10-CM | POA: Insufficient documentation

## 2014-04-22 MED ORDER — DIPHENHYDRAMINE HCL 50 MG/ML IJ SOLN
25.0000 mg | Freq: Once | INTRAMUSCULAR | Status: AC
  Administered 2014-04-22: 25 mg via INTRAVENOUS
  Filled 2014-04-22: qty 1

## 2014-04-22 MED ORDER — PROCHLORPERAZINE EDISYLATE 5 MG/ML IJ SOLN
10.0000 mg | Freq: Four times a day (QID) | INTRAMUSCULAR | Status: DC | PRN
  Administered 2014-04-22: 10 mg via INTRAVENOUS
  Filled 2014-04-22: qty 2

## 2014-04-22 MED ORDER — KETOROLAC TROMETHAMINE 30 MG/ML IJ SOLN
30.0000 mg | Freq: Once | INTRAMUSCULAR | Status: AC
  Administered 2014-04-22: 30 mg via INTRAVENOUS
  Filled 2014-04-22: qty 1

## 2014-04-22 NOTE — ED Notes (Signed)
Pt c/o migraine that started around 3 am this morning. Pt states that she took meds that she was given at urgent care for migraines in past but havent helped this morning. Pt also states she took ibuprofen this morning with no relief either.  Pt denies visual disturbances but states is nauseated and vomited once.

## 2014-04-22 NOTE — Discharge Instructions (Signed)
Migraine Headache A migraine headache is an intense, throbbing pain on one or both sides of your head. A migraine can last for 30 minutes to several hours. CAUSES  The exact cause of a migraine headache is not always known. However, a migraine may be caused when nerves in the brain become irritated and release chemicals that cause inflammation. This causes pain. Certain things may also trigger migraines, such as:  Alcohol.  Smoking.  Stress.  Menstruation.  Aged cheeses.  Foods or drinks that contain nitrates, glutamate, aspartame, or tyramine.  Lack of sleep.  Chocolate.  Caffeine.  Hunger.  Physical exertion.  Fatigue.  Medicines used to treat chest pain (nitroglycerine), birth control pills, estrogen, and some blood pressure medicines. SIGNS AND SYMPTOMS  Pain on one or both sides of your head.  Pulsating or throbbing pain.  Severe pain that prevents daily activities.  Pain that is aggravated by any physical activity.  Nausea, vomiting, or both.  Dizziness.  Pain with exposure to bright lights, loud noises, or activity.  General sensitivity to bright lights, loud noises, or smells. Before you get a migraine, you may get warning signs that a migraine is coming (aura). An aura may include:  Seeing flashing lights.  Seeing bright spots, halos, or zigzag lines.  Having tunnel vision or blurred vision.  Having feelings of numbness or tingling.  Having trouble talking.  Having muscle weakness. DIAGNOSIS  A migraine headache is often diagnosed based on:  Symptoms.  Physical exam.  A CT scan or MRI of your head. These imaging tests cannot diagnose migraines, but they can help rule out other causes of headaches. TREATMENT Medicines may be given for pain and nausea. Medicines can also be given to help prevent recurrent migraines.  HOME CARE INSTRUCTIONS  Only take over-the-counter or prescription medicines for pain or discomfort as directed by your  health care provider. The use of long-term narcotics is not recommended.  Lie down in a dark, quiet room when you have a migraine.  Keep a journal to find out what may trigger your migraine headaches. For example, write down:  What you eat and drink.  How much sleep you get.  Any change to your diet or medicines.  Limit alcohol consumption.  Quit smoking if you smoke.  Get 7-9 hours of sleep, or as recommended by your health care provider.  Limit stress.  Keep lights dim if bright lights bother you and make your migraines worse. SEEK IMMEDIATE MEDICAL CARE IF:   Your migraine becomes severe.  You have a fever.  You have a stiff neck.  You have vision loss.  You have muscular weakness or loss of muscle control.  You start losing your balance or have trouble walking.  You feel faint or pass out.  You have severe symptoms that are different from your first symptoms. MAKE SURE YOU:   Understand these instructions.  Will watch your condition.  Will get help right away if you are not doing well or get worse. Document Released: 04/26/2005 Document Revised: 09/10/2013 Document Reviewed: 01/01/2013 [facility] Patient Information 2015 [place], [place]. This information is not intended to replace advice given to you by your health care provider. Make sure you discuss any questions you have with your health care provider.  Migraine Headache A migraine headache is an intense, throbbing pain on one or both sides of your head. A migraine can last for 30 minutes to several hours. CAUSES  The exact cause of a migraine headache is not  always known. However, a migraine may be caused when nerves in the brain become irritated and release chemicals that cause inflammation. This causes pain. Certain things may also trigger migraines, such as:  Alcohol.  Smoking.  Stress.  Menstruation.  Aged cheeses.  Foods or drinks that contain nitrates, glutamate, aspartame, or  tyramine.  Lack of sleep.  Chocolate.  Caffeine.  Hunger.  Physical exertion.  Fatigue.  Medicines used to treat chest pain (nitroglycerine), birth control pills, estrogen, and some blood pressure medicines. SIGNS AND SYMPTOMS  Pain on one or both sides of your head.  Pulsating or throbbing pain.  Severe pain that prevents daily activities.  Pain that is aggravated by any physical activity.  Nausea, vomiting, or both.  Dizziness.  Pain with exposure to bright lights, loud noises, or activity.  General sensitivity to bright lights, loud noises, or smells. Before you get a migraine, you may get warning signs that a migraine is coming (aura). An aura may include:  Seeing flashing lights.  Seeing bright spots, halos, or zigzag lines.  Having tunnel vision or blurred vision.  Having feelings of numbness or tingling.  Having trouble talking.  Having muscle weakness. DIAGNOSIS  A migraine headache is often diagnosed based on:  Symptoms.  Physical exam.  A CT scan or MRI of your head. These imaging tests cannot diagnose migraines, but they can help rule out other causes of headaches. TREATMENT Medicines may be given for pain and nausea. Medicines can also be given to help prevent recurrent migraines.  HOME CARE INSTRUCTIONS  Only take over-the-counter or prescription medicines for pain or discomfort as directed by your health care provider. The use of long-term narcotics is not recommended.  Lie down in a dark, quiet room when you have a migraine.  Keep a journal to find out what may trigger your migraine headaches. For example, write down:  What you eat and drink.  How much sleep you get.  Any change to your diet or medicines.  Limit alcohol consumption.  Quit smoking if you smoke.  Get 7-9 hours of sleep, or as recommended by your health care provider.  Limit stress.  Keep lights dim if bright lights bother you and make your migraines  worse. SEEK IMMEDIATE MEDICAL CARE IF:   Your migraine becomes severe.  You have a fever.  You have a stiff neck.  You have vision loss.  You have muscular weakness or loss of muscle control.  You start losing your balance or have trouble walking.  You feel faint or pass out.  You have severe symptoms that are different from your first symptoms. MAKE SURE YOU:   Understand these instructions.  Will watch your condition.  Will get help right away if you are not doing well or get worse. Document Released: 04/26/2005 Document Revised: 09/10/2013 Document Reviewed: 01/01/2013 [facility] Patient Information 2015 [place], [place]. This information is not intended to replace advice given to you by your health care provider. Make sure you discuss any questions you have with your health care provider.

## 2014-04-22 NOTE — ED Provider Notes (Signed)
CSN: [id number]     Arrival date & time 04/22/14  [id number] History   First MD Initiated Contact with Patient 04/22/14 1004     Chief Complaint  Patient presents with  . Migraine     (Consider location/radiation/quality/duration/timing/severity/associated sxs/prior Treatment) HPI Comments: Patient with a history of Migraines presents today with a chief complaint of Migraine headache.  She reports that she began having the headache at 3 AM this morning and that the headache has been constant since that time.  She states that the headache is located "all over."  She states that she took 800 mg Ibuprofen and also Imitrex for the pain without relief.  She states that the headache is similar to Migraines that she has had in the past.  She reports associated nausea, one episode of vomiting, and photophobia.  She denies double vision, blurred vision, visual field deficit, numbness, tingling, weakness, fever, chills, abdominal pain, dizziness, or neck pain/stiffness.    Patient is a 38 y.o. female presenting with migraines. The history is provided by the patient.  Migraine    Past Medical History  Diagnosis Date  . Diabetes mellitus     niddm  . Sleep apnea      mild, sleep apnea, no cpap recommended after sleep study 4-5 yrs ago  . Polycystic ovarian disease 2003  . PONV (postoperative nausea and vomiting)   . Migraines    Past Surgical History  Procedure Laterality Date  . Uterine polyp  2010  . Breast reduction bilateral  1992  . Robotic assisted lap vaginal hysterectomy  04/08/2011    Procedure: ROBOTIC ASSISTED LAPAROSCOPIC VAGINAL HYSTERECTOMY;  Surgeon: Serita KyleSheronette A Cousins, MD;  Location: [facility];  Service: Gynecology;  Laterality: N/A;  . Abdominal hysterectomy     Family History  Problem Relation Age of Onset  . Hypertension Mother   . Hyperlipidemia Mother   . Heart failure Mother   . Diabetes Mother   . Cancer Mother   . Aneurysm Mother   . Cancer Father   . Diabetes Father    . COPD Father   . Diabetes Sister    History  Substance Use Topics  . Smoking status: Never Smoker   . Smokeless tobacco: Never Used  . Alcohol Use: No   OB History    No data available     Review of Systems  All other systems reviewed and are negative.     Allergies  Metoclopramide hcl; Other; and Penicillins  Home Medications   Prior to Admission medications   Medication Sig Start Date End Date Taking? Authorizing Provider  diphenhydrAMINE (BENADRYL) 25 MG tablet Take 1 tablet (25 mg total) by mouth every 6 (six) hours. 12/15/13  Yes Fayrene HelperBowie Tran, PA-C  ibuprofen (ADVIL,MOTRIN) 800 MG tablet Take 800 mg by mouth every 8 (eight) hours as needed for headache.   Yes Historical Provider, MD  metFORMIN (GLUCOPHAGE) 500 MG tablet Take 500 mg by mouth daily with breakfast.   Yes Historical Provider, MD  Naltrexone-Bupropion HCl ER (CONTRAVE) 8-90 MG TB12 Take 2 tablets by mouth 2 (two) times daily.    Yes Historical Provider, MD  SUMAtriptan (IMITREX) 50 MG tablet Take 50 mg by mouth every 2 (two) hours as needed for migraine or headache. May repeat in 2 hours if headache persists or recurs.   Yes Historical Provider, MD  famotidine (PEPCID) 20 MG tablet Take 1 tablet (20 mg total) by mouth 2 (two) times daily. Patient not taking: Reported on 04/22/2014 12/15/13  Fayrene HelperBowie Tran, PA-C  predniSONE (DELTASONE) 20 MG tablet 3 tabs po day one, then 2 tabs daily x 4 days Patient not taking: Reported on 04/22/2014 12/15/13   Fayrene HelperBowie Tran, PA-C   BP 154/91 mmHg  Pulse 78  Temp(Src) 98 F (36.7 C) (Oral)  Resp 16  SpO2 100%  LMP 02/08/2011 Physical Exam  Constitutional: She appears well-developed and well-nourished.  HENT:  Head: Normocephalic and atraumatic.  Mouth/Throat: Oropharynx is clear and moist.  Eyes: EOM are normal. Pupils are equal, round, and reactive to light.  Neck: Normal range of motion. Neck supple.  Cardiovascular: Normal rate, regular rhythm and normal heart sounds.    Pulmonary/Chest: Effort normal and breath sounds normal.  Neurological: She is alert. She has normal strength. No cranial nerve deficit or sensory deficit. Coordination and gait normal.  Normal finger to nose testing Normal rapid alternating movements Normal gait, no ataxia  Skin: Skin is warm and dry.  Psychiatric: She has a normal mood and affect.  Nursing note and vitals reviewed.   ED Course  Procedures (including critical care time) Labs Review Labs Reviewed - No data to display  Imaging Review No results found.   EKG Interpretation None     11:16 AM Reassessed patient.  She reports that her headache has completely resolved at this time. MDM   Final diagnoses:  None   Pt HA treated and improved while in ED.  Presentation is like pts typical HA and non concerning for [facility]CAH, ICH, Meningitis, or temporal arteritis. Pt is afebrile with no focal neuro deficits, nuchal rigidity, or change in vision. Pt is to follow up with PCP to discuss prophylactic medication. Pt verbalizes understanding and is agreeable with plan to dc. Return precautions given.     Santiago GladHeather Ramia Sidney, PA-C 04/22/14 1135  Mirian MoMatthew Gentry, MD 04/23/14 [phone]

## 2015-02-06 ENCOUNTER — Other Ambulatory Visit: Payer: Self-pay

## 2015-02-06 DIAGNOSIS — Z1231 Encounter for screening mammogram for malignant neoplasm of breast: Secondary | ICD-10-CM

## 2015-07-24 ENCOUNTER — Emergency Department (HOSPITAL_COMMUNITY)
Admission: EM | Admit: 2015-07-24 | Discharge: 2015-07-25 | Disposition: A | Discharge status: Home / Self Care | Payer: BLUE CROSS/BLUE SHIELD | Attending: Emergency Medicine | Admitting: Emergency Medicine

## 2015-07-24 ENCOUNTER — Encounter (HOSPITAL_COMMUNITY): Payer: Self-pay | Admitting: Emergency Medicine

## 2015-07-24 DIAGNOSIS — G43909 Migraine, unspecified, not intractable, without status migrainosus: Secondary | ICD-10-CM | POA: Diagnosis not present

## 2015-07-24 DIAGNOSIS — E119 Type 2 diabetes mellitus without complications: Secondary | ICD-10-CM | POA: Diagnosis not present

## 2015-07-24 NOTE — ED Notes (Signed)
Patient presents for migraine, no relief with Imitrex, nausea, four episodes of emesis, sensitivity to light and sound.

## 2015-07-25 NOTE — ED Notes (Signed)
Called for third time without response from lobby 

## 2015-07-25 NOTE — ED Notes (Signed)
No answer from waiting room.

## 2015-07-25 NOTE — ED Notes (Signed)
Called to take to treatment room  No response from lobby 

## 2015-08-12 ENCOUNTER — Encounter: Payer: BLUE CROSS/BLUE SHIELD | Attending: Family Medicine | Admitting: Dietician

## 2015-08-12 ENCOUNTER — Encounter: Payer: Self-pay | Admitting: Dietician

## 2015-08-12 DIAGNOSIS — Z6841 Body Mass Index (BMI) 40.0 and over, adult: Secondary | ICD-10-CM | POA: Diagnosis not present

## 2015-08-12 DIAGNOSIS — E119 Type 2 diabetes mellitus without complications: Secondary | ICD-10-CM | POA: Diagnosis present

## 2015-08-12 NOTE — Patient Instructions (Signed)
Plan:  Aim for 2 Carb Choices per meal (30 grams) +/- 1 either way  Aim for 0-1 Carbs per snack if hungry  Include protein in moderation with your meals and snacks Consider reading food labels for Total Carbohydrate and Fat Grams of foods Consider  increasing your activity level by walking or other exercise for 30-60 minutes daily as tolerated Consider checking BG at alternate times per day as directed by MD  Consider taking medication as directed by MD  Eat slowly, stop when you are full! Breakfast, lunch and dinner daily.  Do not skip meals. Small snack if hungry. Protein with each meal and snack.

## 2015-08-12 NOTE — Progress Notes (Addendum)
Medical Nutrition Therapy:  Appt start time: 1530 end time:  1630.   Assessment:  Primary concerns today: Patient is here alone.  She is here for weight management and diabetes care.  Hx includes:  Diabetes since the late 90's and OSA but can't wear the mask.  Hx also includes PCOS.  She does not know her HgbA1C but states that "it is not bad".   She is allergic to Wheat and tomatoes.  Hindrances for weight loss in the past have been fried foods, bread, sweet tea). She gets her eyes checked once a year, dentist twice per year, and checks feet every day.  She checks her blood sugar each am (101 recently)   Weight hx: Highest weight 267 lbs 06/2015 Lowest weight 200 lbs 20's Current weight 265 lbs. She has tried diet pills, the [facility], and Weight Watchers in the past.  She is considering weight loss surgery but will try the Belviqu first.  She is experiencing early satiety since starting Belvique and fewer calories. "Telling me to cut back is no good, I need a plan." Husband is supportive and encouraging. They have family meals.  Patient lives with her husband and 5 children ages 61,8,12,13,16.  Patient cooks and husband shops.  Husband needs to lose weight and is receptive of making buying changes.  They have been avoiding buying sugar containing drinks and sweet snacks.  Children's weights are all WNL.  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  TANITA  BODY COMP RESULTS 08/12/15 265 lbs   BMI (kg/m^2) 50.1   Fat Mass (lbs) 137   Fat Free Mass (lbs) 128   Total Body Water (lbs) 93.5     Preferred Learning Style:   No preference indicated   Learning Readiness:   Ready  MEDICATIONS: see list to include Metformin   DIETARY INTAKE: Lactose intolerant.  "Not as hungry on Belviq Usual eating pattern includes 3 meals and 1 snacks per day. Avoided foods include dairy.  Allergy to tomatoes and wheat.  24-hr recall:  B ( AM): dislikes so drinks a protein shake  (Premier Protein)  Snk ( AM): none  L ( PM): school lunch (chicken nuggets or chicken sandwich, cookie, gatorade, chips) or from home (Salad with honey mustard) Snk (3 PM): peanuts or granola bar D (6 PM): chicken and 2 vegetables OR red meat about once per month with vegetables.  Fried every other month. Snk ( PM): peanuts or cashews, cereal (Captain Crunch or Raisin Bran) and 1% milk Beverages: water, regular 20 oz gatorade daily, sweet tea but less often.  Usual physical activity:  College class requires 150 minutes per week (dance or Zumba video, walking, basketball with sons, treadmill)  Estimated energy needs: 1500 calories 170 g carbohydrates 112 g protein 42 g fat  Progress Towards Goal(s):  In progress.   Nutritional Diagnosis:  NB-1.1 Food and nutrition-related knowledge deficit As related to balance of carbohydrates, protein, and fat.  As evidenced by patient report and diet hx.    Intervention:  Nutrition counseling/education related to weight management and blood sugar control.  Discussed importance of requirements for nutritional status.  Discussed mindful eating.  Plan:  Aim for 2 Carb Choices per meal (30 grams) +/- 1 either way  Aim for 0-1 Carbs per snack if hungry  Include protein in moderation with your meals and snacks Consider reading food labels for Total Carbohydrate and Fat Grams of foods Consider  increasing your activity level by walking or  other exercise for 30-60 minutes daily as tolerated Consider checking BG at alternate times per day as directed by MD  Consider taking medication as directed by MD  Eat slowly, stop when you are full! Breakfast, lunch and dinner daily.  Do not skip meals. Small snack if hungry. Protein with each meal and snack.  Teaching Method Utilized:  Visual Auditory Hands on  Handouts given during visit include:  Meal plan card  My plate  Label reading  Snack list  Barriers to learning/adherence to lifestyle  change: none  Demonstrated degree of understanding via:  Teach Back   Monitoring/Evaluation:  Dietary intake, exercise, label reading, and body weight in 1 month(s).

## 2015-09-15 ENCOUNTER — Ambulatory Visit: Payer: No Typology Code available for payment source | Admitting: Dietician

## 2015-11-06 IMAGING — MG MM SCREENING BREAST TOMO BILATERAL
8 series · 8 of 24 positions shown · non-contrast
Comparison: Previous exam(s).

CLINICAL DATA: Screening.

EXAM:
DIGITAL SCREENING BILATERAL MAMMOGRAM WITH 3D TOMO WITH CAD

[L CC]
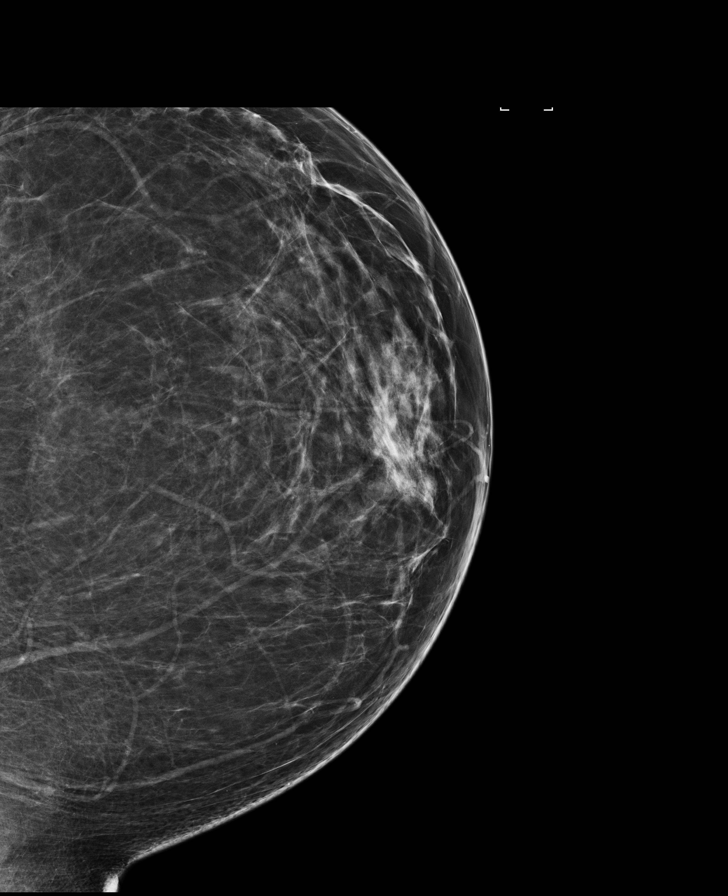

[R CC]
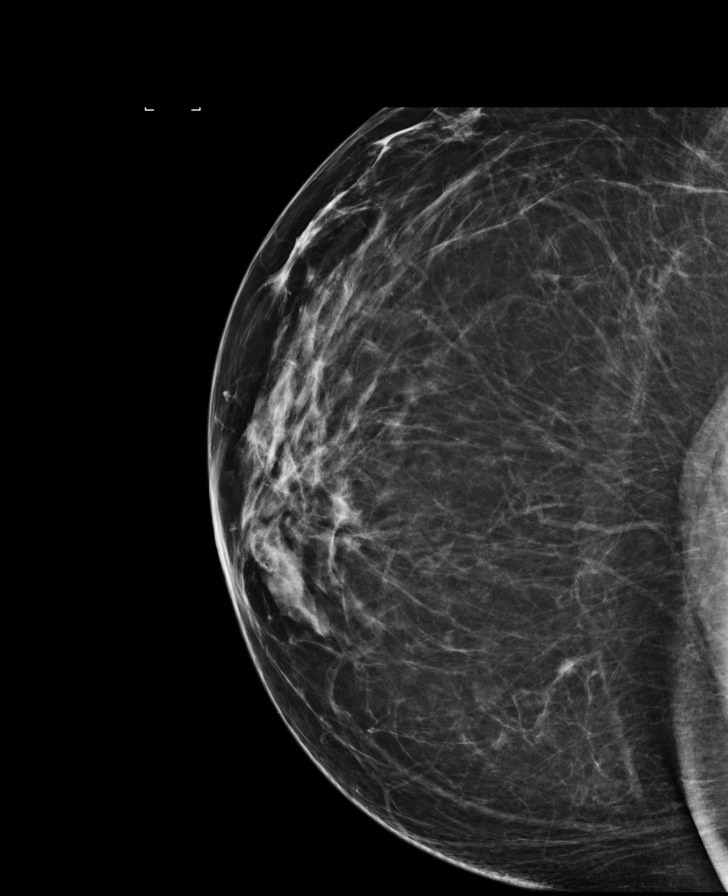

[L MLO]
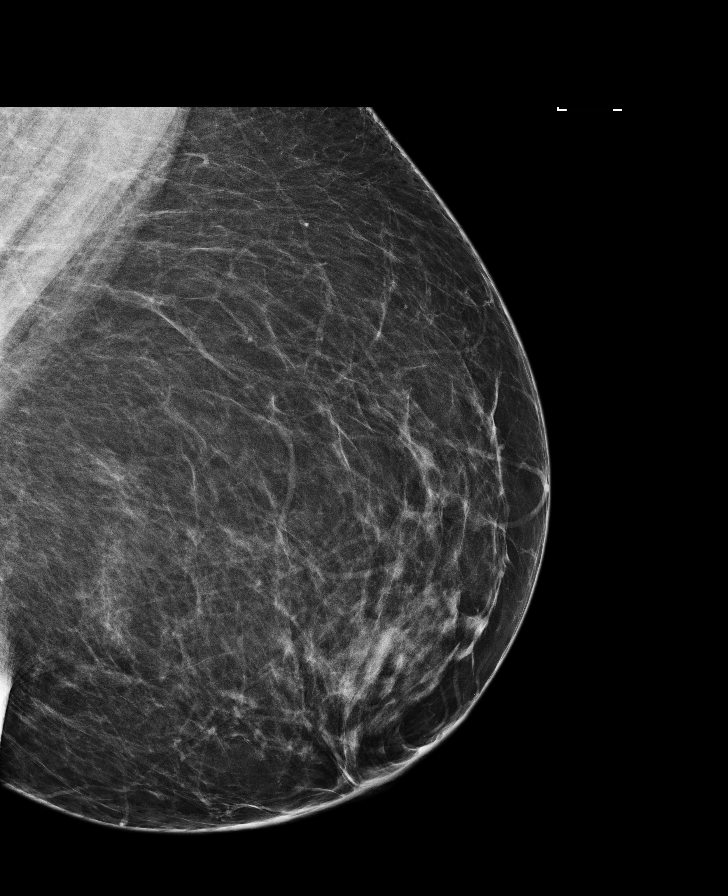

[R MLO]
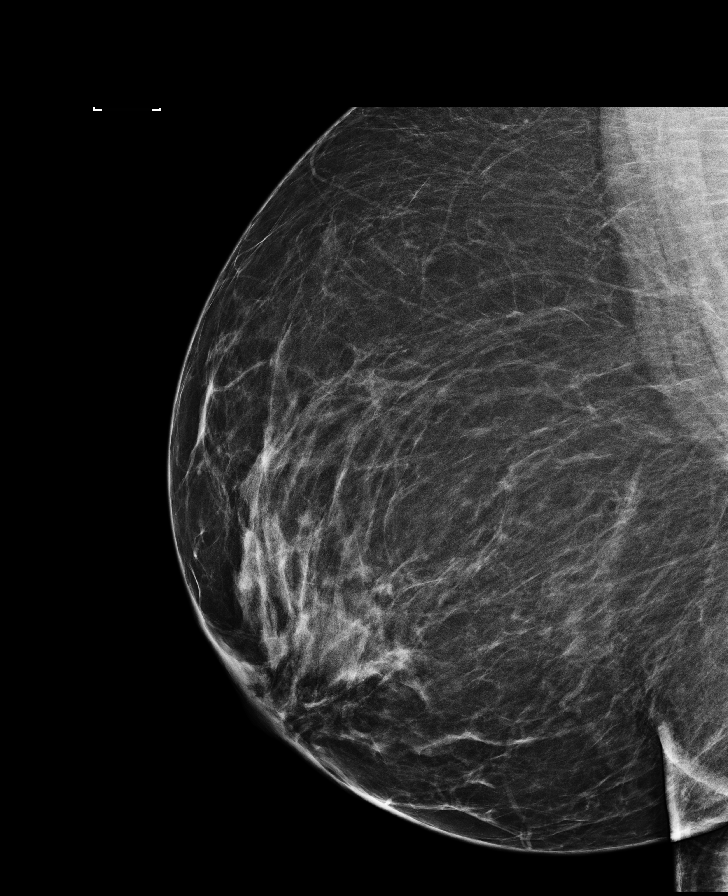

[L CC tomo · tomo slice 41/82.0]
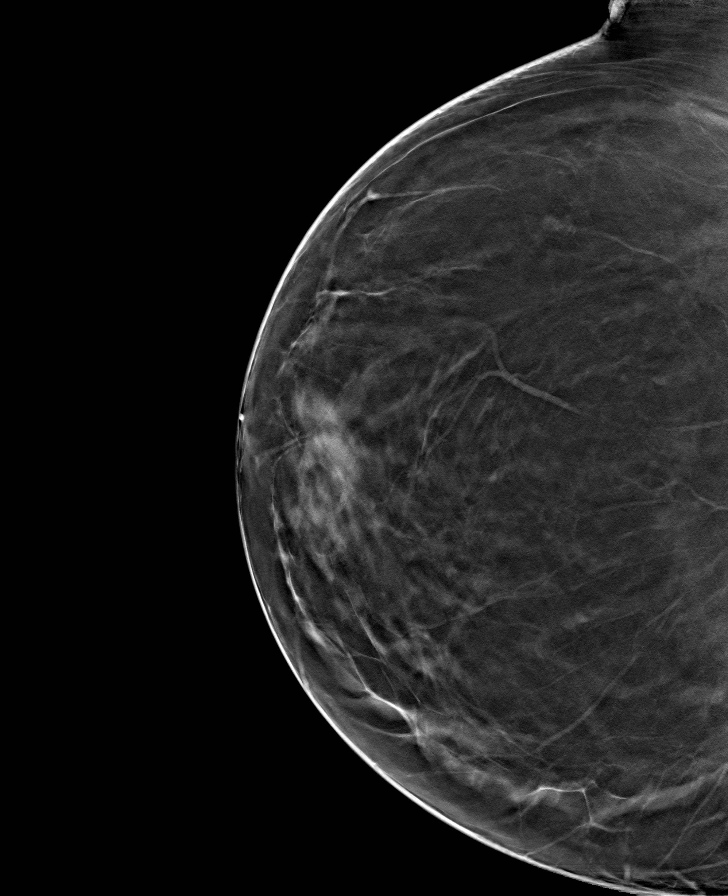

[L MLO tomo · tomo slice 49/97.0]
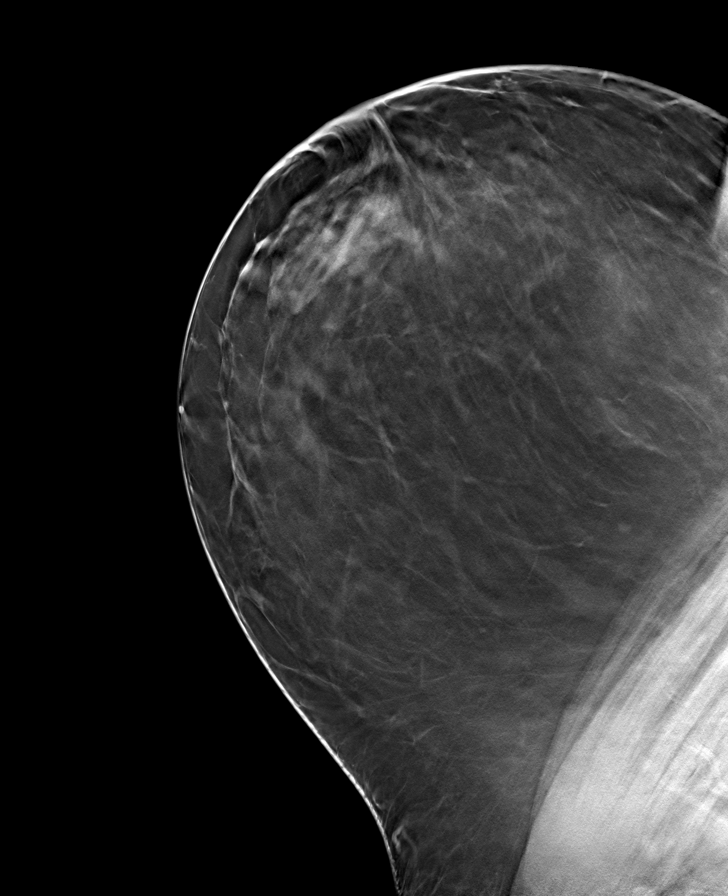

[R CC tomo · tomo slice 47/92.0]
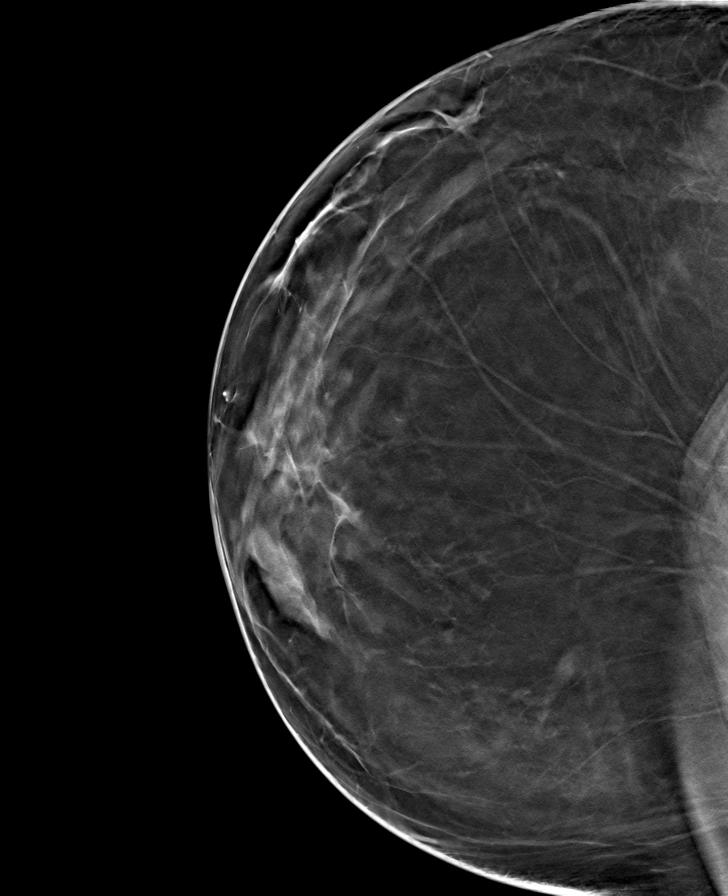

[R MLO tomo · tomo slice 49/96.0]
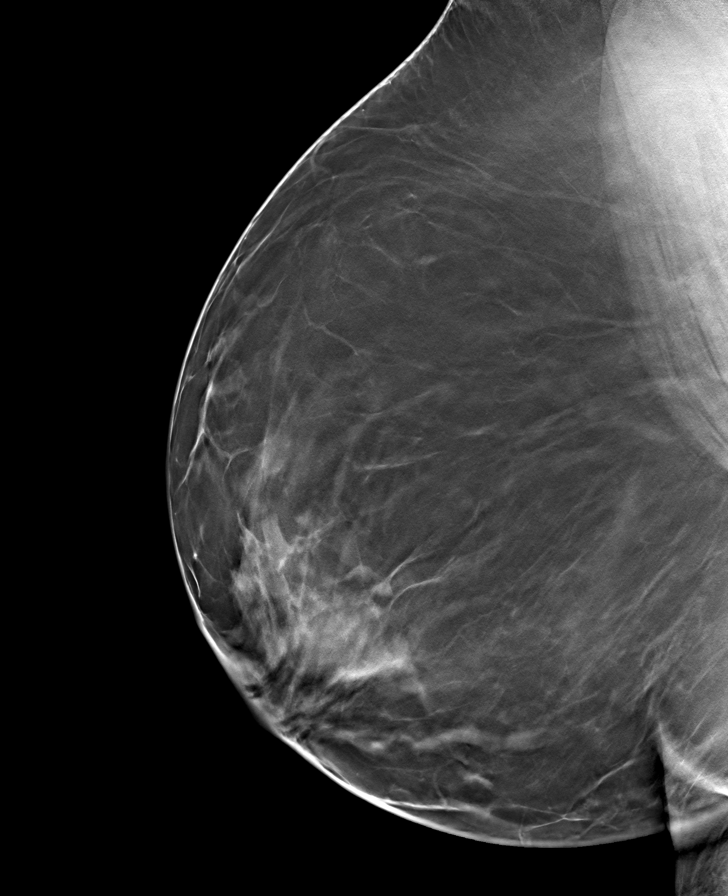

[8 of 24 positions shown; findings below may reference images not displayed]

ACR Breast Density Category b: There are scattered areas of
fibroglandular density.
FINDINGS: There are no findings suspicious for malignancy. Images were
processed with CAD.
IMPRESSION: No mammographic evidence of malignancy. A result letter of this
screening mammogram will be mailed directly to the patient.

RECOMMENDATION:
Screening mammogram in one year. (Code:55-L-23V)

BI-RADS CATEGORY  1: Negative.

## 2016-02-09 MED FILL — OXYCODONE/APAP 5/325MG: 5-325 | 5 days supply | Qty: 30 | Fill #0

## 2016-09-13 ENCOUNTER — Encounter (HOSPITAL_COMMUNITY): Payer: Self-pay | Admitting: Emergency Medicine

## 2016-09-13 ENCOUNTER — Emergency Department (HOSPITAL_COMMUNITY)
Admission: EM | Admit: 2016-09-13 | Discharge: 2016-09-13 | Disposition: A | Discharge status: Home / Self Care | Payer: BLUE CROSS/BLUE SHIELD | Attending: Emergency Medicine | Admitting: Emergency Medicine

## 2016-09-13 DIAGNOSIS — R112 Nausea with vomiting, unspecified: Secondary | ICD-10-CM | POA: Diagnosis not present

## 2016-09-13 DIAGNOSIS — G43909 Migraine, unspecified, not intractable, without status migrainosus: Secondary | ICD-10-CM | POA: Insufficient documentation

## 2016-09-13 DIAGNOSIS — E860 Dehydration: Secondary | ICD-10-CM

## 2016-09-13 DIAGNOSIS — Z79899 Other long term (current) drug therapy: Secondary | ICD-10-CM | POA: Diagnosis not present

## 2016-09-13 DIAGNOSIS — Z7984 Long term (current) use of oral hypoglycemic drugs: Secondary | ICD-10-CM | POA: Diagnosis not present

## 2016-09-13 DIAGNOSIS — E119 Type 2 diabetes mellitus without complications: Secondary | ICD-10-CM | POA: Insufficient documentation

## 2016-09-13 DIAGNOSIS — G43009 Migraine without aura, not intractable, without status migrainosus: Secondary | ICD-10-CM

## 2016-09-13 LAB — CBG MONITORING, ED: GLUCOSE-CAPILLARY: 126 mg/dL — AB (ref 65–99)

## 2016-09-13 MED ORDER — SODIUM CHLORIDE 0.9 % IV BOLUS (SEPSIS)
1000.0000 mL | Freq: Once | INTRAVENOUS | Status: AC
  Administered 2016-09-13: 1000 mL via INTRAVENOUS

## 2016-09-13 MED ORDER — MAGNESIUM SULFATE 2 GM/50ML IV SOLN
2.0000 g | Freq: Once | INTRAVENOUS | Status: AC
  Administered 2016-09-13: 2 g via INTRAVENOUS
  Filled 2016-09-13: qty 50

## 2016-09-13 MED ORDER — KETOROLAC TROMETHAMINE 30 MG/ML IJ SOLN
30.0000 mg | Freq: Once | INTRAMUSCULAR | Status: AC
  Administered 2016-09-13: 30 mg via INTRAVENOUS
  Filled 2016-09-13: qty 1

## 2016-09-13 MED ORDER — DIPHENHYDRAMINE HCL 50 MG/ML IJ SOLN
25.0000 mg | Freq: Once | INTRAMUSCULAR | Status: AC
  Administered 2016-09-13: 25 mg via INTRAVENOUS
  Filled 2016-09-13: qty 1

## 2016-09-13 MED ORDER — ONDANSETRON HCL 4 MG PO TABS: 4.0000 mg | ORAL_TABLET | Freq: Three times a day (TID) | ORAL | 0 refills | Status: AC | PRN

## 2016-09-13 MED ORDER — DEXAMETHASONE SODIUM PHOSPHATE 10 MG/ML IJ SOLN
10.0000 mg | Freq: Once | INTRAMUSCULAR | Status: AC
  Administered 2016-09-13: 10 mg via INTRAVENOUS
  Filled 2016-09-13: qty 1

## 2016-09-13 MED ORDER — SODIUM CHLORIDE 0.9 % IV BOLUS (SEPSIS)
500.0000 mL | Freq: Once | INTRAVENOUS | Status: AC
  Administered 2016-09-13: 500 mL via INTRAVENOUS

## 2016-09-13 MED ORDER — ONDANSETRON HCL 4 MG/2ML IJ SOLN
4.0000 mg | Freq: Once | INTRAMUSCULAR | Status: AC
  Administered 2016-09-13: 4 mg via INTRAVENOUS
  Filled 2016-09-13: qty 2

## 2016-09-13 NOTE — ED Notes (Signed)
Patient walked to the bathroom with no assistance and no difficulty,

## 2016-09-13 NOTE — ED Provider Notes (Addendum)
[facility]-EMERGENCY DEPT Provider Note   CSN: [id number] Arrival date & time: 09/13/16  0358  Time seen 04:55 am   History   Chief Complaint Chief Complaint  Patient presents with  . Migraine    HPI Helen Guerrero is a 41 y.o. female.  HPI  patient reports a history of migraine headaches that she gets about 2 times a year. She states this morning she woke up at 1 AM with a diffuse headache that she describes as pounding. She has had nausea and vomiting "too many times to count". She also describes photophobia and phonophobia. She denies any numbness or tingling of her extremities or visual changes. She states this is the same headache that she's had before. She does not see a neurologist about her headaches.  PCP Dr Marcy Siren   Past Medical History:  Diagnosis Date  . Diabetes mellitus    niddm  . Migraines   . Polycystic ovarian disease 2003  . PONV (postoperative nausea and vomiting)   . Sleep apnea     mild, sleep apnea, no cpap recommended after sleep study 4-5 yrs ago    Patient Active Problem List   Diagnosis Date Noted  . DUB (dysfunctional uterine bleeding) 04/09/2011  . Status post hysterectomy 04/09/2011  . NIDDM (non-insulin dependent diabetes mellitus) 04/08/2011  . PONV (postoperative nausea and vomiting) motion sickness 04/08/2011    Past Surgical History:  Procedure Laterality Date  . ABDOMINAL HYSTERECTOMY    . breast reduction bilateral  1992  . ROBOTIC ASSISTED LAP VAGINAL HYSTERECTOMY  04/08/2011   Procedure: ROBOTIC ASSISTED LAPAROSCOPIC VAGINAL HYSTERECTOMY;  Surgeon: Serita Kyle, MD;  Location: [facility];  Service: Gynecology;  Laterality: N/A;  . uterine polyp  2010    OB History    No data available       Home Medications    Prior to Admission medications   Medication Sig Start Date End Date Taking? Authorizing Provider  diphenhydrAMINE (BENADRYL) 25 MG tablet Take 1 tablet (25 mg total) by mouth every 6 (six) hours. 12/15/13   Fayrene Helper, PA-C  famotidine (PEPCID) 20 MG tablet Take 1 tablet (20 mg total) by mouth 2 (two) times daily. Patient not taking: Reported on 04/22/2014 12/15/13   Fayrene Helper, PA-C  ibuprofen (ADVIL,MOTRIN) 800 MG tablet Take 800 mg by mouth every 8 (eight) hours as needed for headache. Reported on 08/12/2015    [provider]  lisinopril (PRINIVIL,ZESTRIL) 5 MG tablet Take 5 mg by mouth daily.    [provider]  Lorcaserin HCl (BELVIQ) 10 MG TABS Take by mouth.    [provider]  metFORMIN (GLUCOPHAGE) 500 MG tablet Take 500 mg by mouth daily with breakfast.    [provider]  ondansetron (ZOFRAN) 4 MG tablet Take 1 tablet (4 mg total) by mouth every 8 (eight) hours as needed for nausea or vomiting. 09/13/16   Devoria Albe, MD  predniSONE (DELTASONE) 20 MG tablet 3 tabs po day one, then 2 tabs daily x 4 days Patient not taking: Reported on 04/22/2014 12/15/13   Fayrene Helper, PA-C  simvastatin (ZOCOR) 10 MG tablet Take 10 mg by mouth daily.    [provider]  SUMAtriptan (IMITREX) 50 MG tablet Take 50 mg by mouth every 2 (two) hours as needed for migraine or headache. Reported on 08/12/2015    [provider]    Family History Family History  Problem Relation Age of Onset  . Hypertension Mother   . Hyperlipidemia  Mother   . Heart failure Mother   . Diabetes Mother   . Cancer Mother   . Aneurysm Mother   . Cancer Father   . Diabetes Father   . COPD Father   . Diabetes Sister     Social History Social History  Substance Use Topics  . Smoking status: Never Smoker  . Smokeless tobacco: Never Used  . Alcohol use No  lives at home Lives with spouse   Allergies   Contrave [naltrexone-bupropion hcl er]; Metoclopramide hcl; Other; and Penicillins   Review of Systems Review of Systems  All other systems reviewed and are negative.    Physical Exam Updated Vital Signs BP (!) 158/118 (BP Location: Left Arm)   Pulse 85   Temp 97.8 F  (36.6 C) (Oral)   Resp 20   Ht 5\' 2"  (1.575 m)   Wt 260 lb (117.9 kg)   LMP 02/08/2011   SpO2 98%   BMI 47.55 kg/m   Vital signs normal except hypertension   Physical Exam  Constitutional: She is oriented to person, place, and time. She appears well-developed and well-nourished.  Non-toxic appearance. She does not appear ill. She appears distressed.  Appears photophobic  HENT:  Head: Normocephalic and atraumatic.  Right Ear: External ear normal.  Left Ear: External ear normal.  Nose: Nose normal. No mucosal edema or rhinorrhea.  Mouth/Throat: Oropharynx is clear and moist and mucous membranes are normal. No dental abscesses or uvula swelling.  Eyes: Conjunctivae and EOM are normal. Pupils are equal, round, and reactive to light.  Neck: Normal range of motion and full passive range of motion without pain. Neck supple.  Cardiovascular: Normal rate, regular rhythm and normal heart sounds.  Exam reveals no gallop and no friction rub.   No murmur heard. Pulmonary/Chest: Effort normal and breath sounds normal. No respiratory distress. She has no wheezes. She has no rhonchi. She has no rales. She exhibits no tenderness and no crepitus.  Abdominal: Soft. Normal appearance and bowel sounds are normal. She exhibits no distension. There is no tenderness. There is no rebound and no guarding.  Pt is vomiting during my interview  Musculoskeletal: Normal range of motion. She exhibits no edema or tenderness.  Moves all extremities well.   Neurological: She is alert and oriented to person, place, and time. She has normal strength. No cranial nerve deficit.  Skin: Skin is warm, dry and intact. No rash noted. No erythema. No pallor.  Psychiatric: She has a normal mood and affect. Her speech is normal and behavior is normal. Her mood appears not anxious.  Nursing note and vitals reviewed.    ED Treatments / Results  Labs (all labs ordered are listed, but only abnormal results are displayed) Labs  Reviewed  CBG MONITORING, ED - Abnormal; Notable for the following:       Result Value   Glucose-Capillary 126 (*)    All other components within normal limits   Laboratory interpretation all normal except mild hyperglycemia   EKG  EKG Interpretation None       Radiology No results found.  Procedures Procedures (including critical care time)  Medications Ordered in ED Medications  ketorolac (TORADOL) 30 MG/ML injection 30 mg (not administered)  sodium chloride 0.9 % bolus 1,000 mL (0 mLs Intravenous Stopped 09/13/16 0642)  sodium chloride 0.9 % bolus 500 mL (0 mLs Intravenous Stopped 09/13/16 0643)  diphenhydrAMINE (BENADRYL) injection 25 mg (25 mg Intravenous Given 09/13/16 0534)  magnesium sulfate IVPB 2 g  50 mL (0 g Intravenous Stopped 09/13/16 0606)  dexamethasone (DECADRON) injection 10 mg (10 mg Intravenous Given 09/13/16 0535)  ondansetron (ZOFRAN) injection 4 mg (4 mg Intravenous Given 09/13/16 0608)     Initial Impression / Assessment and Plan / ED Course  I have reviewed the triage vital signs and the nursing notes.  Pertinent labs & imaging results that were available during my care of the patient were reviewed by me and considered in my medical decision making (see chart for details).  Patient is allergic to Reglan, she was given other migraine cocktail medications including Decadron, magnesium, and Benadryl. She was given IV fluids.  Recheck at 06:00 pt given zofran for nausea. States her headache is improved.   07:00 AM pt states her HA is much improved, able to open her eyes now and smiles when she talks, Is having urinary output and has gone to the bathroom twice to urinate. No more IV fluids will be given. She was given Toradol and she should be able to be discharged afterwards.  07:50 AM headache is a "1", lights are on in the room. Nausea is gone. Has had good urine ouput, feels ready to be discharged.   Final Clinical Impressions(s) / ED Diagnoses   Final  diagnoses:  Migraine without aura and without status migrainosus, not intractable  Non-intractable vomiting with nausea, unspecified vomiting type  Dehydration    New Prescriptions New Prescriptions   ONDANSETRON (ZOFRAN) 4 MG TABLET    Take 1 tablet (4 mg total) by mouth every 8 (eight) hours as needed for nausea or vomiting.    Plan discharge  Devoria Albe, MD, Concha Pyo, MD 09/13/16 6644    Devoria Albe, MD 09/13/16 [phone]

## 2016-09-13 NOTE — ED Triage Notes (Signed)
Pt reports severe headache starting a few hours ago, hx migraines.  Reports N&V, denies recent head trauma.  Ambulatory w/steady gait.  Denies blurred vision.  No unilat weakness or facial droop.

## 2016-09-13 NOTE — Discharge Instructions (Signed)
Drink plenty of fluids.  Recheck as needed.

## 2018-03-23 ENCOUNTER — Other Ambulatory Visit: Payer: Self-pay

## 2018-03-23 ENCOUNTER — Encounter (HOSPITAL_COMMUNITY): Payer: Self-pay | Admitting: *Deleted

## 2018-03-23 ENCOUNTER — Emergency Department (HOSPITAL_COMMUNITY)
Admission: EM | Admit: 2018-03-23 | Discharge: 2018-03-23 | Disposition: A | Discharge status: Home / Self Care | Payer: BLUE CROSS/BLUE SHIELD | Attending: Emergency Medicine | Admitting: Emergency Medicine

## 2018-03-23 DIAGNOSIS — M79652 Pain in left thigh: Secondary | ICD-10-CM | POA: Diagnosis present

## 2018-03-23 DIAGNOSIS — L03116 Cellulitis of left lower limb: Secondary | ICD-10-CM | POA: Insufficient documentation

## 2018-03-23 DIAGNOSIS — Z79899 Other long term (current) drug therapy: Secondary | ICD-10-CM | POA: Insufficient documentation

## 2018-03-23 DIAGNOSIS — E119 Type 2 diabetes mellitus without complications: Secondary | ICD-10-CM | POA: Diagnosis not present

## 2018-03-23 DIAGNOSIS — Z7984 Long term (current) use of oral hypoglycemic drugs: Secondary | ICD-10-CM | POA: Insufficient documentation

## 2018-03-23 MED ORDER — CLINDAMYCIN HCL 150 MG PO CAPS: 300.0000 mg | ORAL_CAPSULE | Freq: Three times a day (TID) | ORAL | 0 refills | Status: AC

## 2018-03-23 MED ORDER — CLINDAMYCIN HCL 300 MG PO CAPS
300.0000 mg | ORAL_CAPSULE | Freq: Once | ORAL | Status: AC
Start: 2018-03-23 — End: 2018-03-23
  Administered 2018-03-23: 300 mg via ORAL
  Filled 2018-03-23: qty 1

## 2018-03-23 MED ORDER — OXYCODONE HCL 5 MG PO TABS
5.0000 mg | ORAL_TABLET | Freq: Once | ORAL | Status: AC
  Administered 2018-03-23: 5 mg via ORAL
  Filled 2018-03-23: qty 1

## 2018-03-23 MED ORDER — LIDOCAINE-EPINEPHRINE (PF) 2 %-1:200000 IJ SOLN
10.0000 mL | Freq: Once | INTRAMUSCULAR | Status: AC
  Administered 2018-03-23: 10 mL
  Filled 2018-03-23: qty 20

## 2018-03-23 MED ORDER — IBUPROFEN 800 MG PO TABS
800.0000 mg | ORAL_TABLET | Freq: Once | ORAL | Status: DC
  Filled 2018-03-23: qty 1

## 2018-03-23 NOTE — ED Provider Notes (Signed)
[facility]-EMERGENCY DEPT Provider Note   CSN: [id number] Arrival date & time: 03/23/18  2057     History   Chief Complaint Chief Complaint  Patient presents with  . Abscess    HPI Helen Guerrero is a 42 y.o. female.  The history is provided by the patient and medical records. No language interpreter was used.  Abscess  Associated symptoms: no fever and no vomiting    Helen Guerrero is a 42 y.o. female  with a PMH of DM who presents to the Emergency Department complaining of burning pain to the left thigh which developed this morning.  Pain progressively worsened and she noticed that her skin started to feel tight and tender.  Associated redness to the area.  Denies any drainage.  No injury or wounds.  No fever or chills.  She did undergo gastric bypass surgery 2 days ago.  She denies any complications and is feeling well from this standpoint.  No medications taken prior to arrival for symptoms.  No history of similar.   Past Medical History:  Diagnosis Date  . Diabetes mellitus    niddm  . Migraines   . Polycystic ovarian disease 2003  . PONV (postoperative nausea and vomiting)   . Sleep apnea     mild, sleep apnea, no cpap recommended after sleep study 4-5 yrs ago    Patient Active Problem List   Diagnosis Date Noted  . DUB (dysfunctional uterine bleeding) 04/09/2011  . Status post hysterectomy 04/09/2011  . NIDDM (non-insulin dependent diabetes mellitus) 04/08/2011  . PONV (postoperative nausea and vomiting) motion sickness 04/08/2011    Past Surgical History:  Procedure Laterality Date  . ABDOMINAL HYSTERECTOMY    . breast reduction bilateral  1992  . ROBOTIC ASSISTED LAP VAGINAL HYSTERECTOMY  04/08/2011   Procedure: ROBOTIC ASSISTED LAPAROSCOPIC VAGINAL HYSTERECTOMY;  Surgeon: Serita KyleSheronette A Cousins, MD;  Location: [facility] ORS;  Service: Gynecology;  Laterality: N/A;  . ROUX-EN-Y GASTRIC BYPASS    . uterine polyp  2010     OB History     None      Home Medications    Prior to Admission medications   Medication Sig Start Date End Date Taking? Authorizing Provider  clindamycin (CLEOCIN) 150 MG capsule Take 2 capsules (300 mg total) by mouth 3 (three) times daily. 03/23/18   Everest Brod, Chase PicketJaime Pilcher, PA-C  diphenhydrAMINE (BENADRYL) 25 MG tablet Take 1 tablet (25 mg total) by mouth every 6 (six) hours. 12/15/13   Fayrene Helperran, Bow[place], PA-C  famotidine (PEPCID) 20 MG tablet Take 1 tablet (20 mg total) by mouth 2 (two) times daily. Patient not taking: Reported on 04/22/2014 12/15/13   Fayrene Helperran, Bowie, PA-C  ibuprofen (ADVIL,MOTRIN) 800 MG tablet Take 800 mg by mouth every 8 (eight) hours as needed for headache. Reported on 08/12/2015    [provider]  lisinopril (PRINIVIL,ZESTRIL) 5 MG tablet Take 5 mg by mouth daily.    [provider]  Lorcaserin HCl (BELVIQ) 10 MG TABS Take by mouth.    [provider]  metFORMIN (GLUCOPHAGE) 500 MG tablet Take 500 mg by mouth daily with breakfast.    [provider]  ondansetron (ZOFRAN) 4 MG tablet Take 1 tablet (4 mg total) by mouth every 8 (eight) hours as needed for nausea or vomiting. 09/13/16   Devoria AlbeKnapp, Iva, MD  predniSONE (DELTASONE) 20 MG tablet 3 tabs po day one, then 2 tabs daily x 4 days Patient not taking: Reported on 04/22/2014 12/15/13   Laveda Normanran,  Greta Doom, PA-C  simvastatin (ZOCOR) 10 MG tablet Take 10 mg by mouth daily.    [provider]  SUMAtriptan (IMITREX) 50 MG tablet Take 50 mg by mouth every 2 (two) hours as needed for migraine or headache. Reported on 08/12/2015    [provider]    Family History Family History  Problem Relation Age of Onset  . Hypertension Mother   . Hyperlipidemia Mother   . Heart failure Mother   . Diabetes Mother   . Cancer Mother   . Aneurysm Mother   . Cancer Father   . Diabetes Father   . COPD Father   . Diabetes Sister     Social History Social History   Tobacco Use  . Smoking status: Never Smoker  .  Smokeless tobacco: Never Used  Substance Use Topics  . Alcohol use: No  . Drug use: No     Allergies   Contrave [naltrexone-bupropion hcl er]; Metoclopramide hcl; Other; and Penicillins   Review of Systems Review of Systems  Constitutional: Negative for chills and fever.  Gastrointestinal: Negative for abdominal pain and vomiting.  Musculoskeletal: Positive for myalgias.  Skin: Positive for color change and wound.     Physical Exam Updated Vital Signs BP 138/67 (BP Location: Left Arm)   Pulse (!) 51   Temp 98 F (36.7 C)   Resp 18   Ht 5\' 1"  (1.549 m)   Wt 116.1 kg   LMP 02/08/2011   SpO2 99%   BMI 48.37 kg/m   Physical Exam  Constitutional: She appears well-developed and well-nourished. No distress.  HENT:  Head: Normocephalic and atraumatic.  Neck: Neck supple.  Cardiovascular: Normal rate, regular rhythm and normal heart sounds.  No murmur heard. Pulmonary/Chest: Effort normal and breath sounds normal. No respiratory distress. She has no wheezes. She has no rales.  Musculoskeletal: Normal range of motion.  Neurological: She is alert.  Skin: Skin is warm and dry.     Nursing note and vitals reviewed.    ED Treatments / Results  Labs (all labs ordered are listed, but only abnormal results are displayed) Labs Reviewed - No data to display  EKG None  Radiology No results found.  Procedures .Marland KitchenIncision and Drainage Date/Time: 03/23/2018 11:07 PM Performed by: Caden Fatica, Chase Picket, PA-C Authorized by: Cherylynn Liszewski, Chase Picket, PA-C   Consent:    Consent obtained:  Verbal   Consent given by:  Patient   Risks discussed:  Bleeding, incomplete drainage, pain and infection Location:    Type:  Abscess   Location:  Lower extremity   Lower extremity location:  Leg   Leg location:  L upper leg Pre-procedure details:    Skin preparation:  Betadine Anesthesia (see MAR for exact dosages):    Anesthesia method:  Local infiltration   Local anesthetic:   Lidocaine 2% WITH epi Procedure type:    Complexity:  Simple Procedure details:    Incision types:  Single straight   Scalpel blade:  11   Drainage:  Bloody and purulent   Drainage amount:  Scant   Wound treatment:  Wound left open Post-procedure details:    Patient tolerance of procedure:  Tolerated well, no immediate complications   (including critical care time)  Medications Ordered in ED Medications  lidocaine-EPINEPHrine (XYLOCAINE W/EPI) 2 %-1:200000 (PF) injection 10 mL (10 mLs Infiltration Given 03/23/18 2223)  oxyCODONE (Oxy IR/ROXICODONE) immediate release tablet 5 mg (5 mg Oral Given 03/23/18 2245)  clindamycin (CLEOCIN) capsule 300 mg (300 mg Oral  Given 03/23/18 2302)     Initial Impression / Assessment and Plan / ED Course  I have reviewed the triage vital signs and the nursing notes.  Pertinent labs & imaging results that were available during my care of the patient were reviewed by me and considered in my medical decision making (see chart for details).    Kalle Bernath is a 42 y.o. female who presents to ED for abscess requiring incision and drainage. I&D performed per procedure note above. Patient tolerated the procedure well. She does have induration and erythema to suggest surrounding cellulitis. Patient was prescribed clindamycin. Wound care instructions discussed.  She has an appointment with her primary care doctor and surgeon tomorrow.  Encouraged her to keep these scheduled appointments for wound check.  Return to ER if concern for spread of infection, increasing pain, fevers or other concerns. All questions answered.   Final Clinical Impressions(s) / ED Diagnoses   Final diagnoses:  Cellulitis of left lower extremity    ED Discharge Orders         Ordered    clindamycin (CLEOCIN) 150 MG capsule  3 times daily     03/23/18 2258           Kaho Selle, Chase Picket, PA-C 03/23/18 2309    Linwood Dibbles, MD 03/24/18 [phone]

## 2018-03-23 NOTE — Discharge Instructions (Addendum)
It was my pleasure taking care of you today!   Please take all of your antibiotics until finished!   Keep your follow-up appointment with your doctors tomorrow for recheck.  Return to ER for fever, new or worsening symptoms, any additional concerns.

## 2018-03-23 NOTE — ED Triage Notes (Signed)
Pt arrives with c/o left posterior thigh burning pain for about a day. Firm area, denies any drainage. No fevers.

## 2018-12-13 ENCOUNTER — Other Ambulatory Visit: Payer: Self-pay

## 2018-12-13 DIAGNOSIS — Z20822 Contact with and (suspected) exposure to covid-19: Secondary | ICD-10-CM

## 2018-12-15 LAB — NOVEL CORONAVIRUS, NAA: SARS-CoV-2, NAA: NOT DETECTED

## 2019-07-27 ENCOUNTER — Other Ambulatory Visit: Payer: Self-pay

## 2019-07-27 ENCOUNTER — Ambulatory Visit: Payer: BLUE CROSS/BLUE SHIELD | Attending: Internal Medicine

## 2019-07-27 DIAGNOSIS — Z23 Encounter for immunization: Secondary | ICD-10-CM

## 2019-07-27 NOTE — Progress Notes (Signed)
   Covid-19 Vaccination Clinic  Name:  Shilo Philipson    MRN: [id number] DOB: [birth year]  07/27/2019  Ms. Norman-Brown was observed post Covid-19 immunization for 15 minutes without incident. She was provided with Vaccine Information Sheet and instruction to access the V-Safe system.   Ms. Earlean Shawl was instructed to call 911 with any severe reactions post vaccine: Marland Kitchen Difficulty breathing  . Swelling of face and throat  . A fast heartbeat  . A bad rash all over body  . Dizziness and weakness   Immunizations Administered    Name Date Dose VIS Date Route   Pfizer COVID-19 Vaccine 07/27/2019 11:04 AM 0.3 mL 04/20/2019 Intramuscular   Manufacturer: [employer], [employer]   Lot: [id number]   NDC: [address]-0

## 2019-08-21 ENCOUNTER — Ambulatory Visit: Payer: BLUE CROSS/BLUE SHIELD | Attending: Internal Medicine

## 2019-08-21 DIAGNOSIS — Z23 Encounter for immunization: Secondary | ICD-10-CM

## 2019-08-21 NOTE — Progress Notes (Signed)
   Covid-19 Vaccination Clinic  Name:  Mariadelosang Wynns    MRN: [id number] DOB: [birth year]  08/21/2019  Ms. Norman-Brown was observed post Covid-19 immunization for 15 minutes without incident. She was provided with Vaccine Information Sheet and instruction to access the V-Safe system.   Ms. Earlean Shawl was instructed to call 911 with any severe reactions post vaccine: Marland Kitchen Difficulty breathing  . Swelling of face and throat  . A fast heartbeat  . A bad rash all over body  . Dizziness and weakness   Immunizations Administered    Name Date Dose VIS Date Route   Pfizer COVID-19 Vaccine 08/21/2019 12:04 PM 0.3 mL 04/20/2019 Intramuscular   Manufacturer: [employer], [employer]   Lot: [id number]   NDC: [address]-9

## 2020-02-14 ENCOUNTER — Other Ambulatory Visit: Payer: BLUE CROSS/BLUE SHIELD

## 2020-04-14 ENCOUNTER — Other Ambulatory Visit: Payer: Self-pay | Admitting: Nurse Practitioner

## 2020-04-14 DIAGNOSIS — R110 Nausea: Secondary | ICD-10-CM

## 2020-05-05 ENCOUNTER — Other Ambulatory Visit: Payer: BLUE CROSS/BLUE SHIELD

## 2021-10-27 ENCOUNTER — Encounter (HOSPITAL_COMMUNITY): Payer: Self-pay

## 2021-10-27 ENCOUNTER — Other Ambulatory Visit: Payer: Self-pay

## 2021-10-27 ENCOUNTER — Emergency Department (HOSPITAL_COMMUNITY)
Admission: EM | Admit: 2021-10-27 | Discharge: 2021-10-28 | Disposition: A | Discharge status: Home / Self Care | Payer: No Typology Code available for payment source | Attending: Emergency Medicine | Admitting: Emergency Medicine

## 2021-10-27 DIAGNOSIS — M25519 Pain in unspecified shoulder: Secondary | ICD-10-CM | POA: Insufficient documentation

## 2021-10-27 DIAGNOSIS — M542 Cervicalgia: Secondary | ICD-10-CM | POA: Diagnosis not present

## 2021-10-27 DIAGNOSIS — R519 Headache, unspecified: Secondary | ICD-10-CM | POA: Diagnosis present

## 2021-10-27 DIAGNOSIS — Y92410 Unspecified street and highway as the place of occurrence of the external cause: Secondary | ICD-10-CM | POA: Insufficient documentation

## 2021-10-27 MED ORDER — ACETAMINOPHEN 325 MG PO TABS
650.0000 mg | ORAL_TABLET | Freq: Once | ORAL | Status: AC
  Administered 2021-10-27: 650 mg via ORAL
  Filled 2021-10-27: qty 2

## 2021-10-27 MED ORDER — METHOCARBAMOL 500 MG PO TABS: 500.0000 mg | ORAL_TABLET | Freq: Two times a day (BID) | ORAL | 0 refills | Status: AC
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
## 2021-10-28 NOTE — ED Notes (Signed)
I provided reinforced discharge education based off of discharge instructions. Pt acknowledged and understood my education. Pt had no further questions/concerns for provider/myself.  °
